# Patient Record
Sex: Female | Born: 1980 | Race: White | Hispanic: No | Marital: Married | Smoking: Never smoker
Health system: Southern US, Community
[De-identification: ages and names within clinical notes are randomized; demographics above are authoritative.]

## PROBLEM LIST (undated history)

## (undated) DIAGNOSIS — Z789 Other specified health status: Secondary | ICD-10-CM

## (undated) DIAGNOSIS — F329 Major depressive disorder, single episode, unspecified: Secondary | ICD-10-CM

## (undated) DIAGNOSIS — K589 Irritable bowel syndrome without diarrhea: Secondary | ICD-10-CM

## (undated) DIAGNOSIS — F32A Depression, unspecified: Secondary | ICD-10-CM

## (undated) DIAGNOSIS — Z6841 Body Mass Index (BMI) 40.0 and over, adult: Secondary | ICD-10-CM

## (undated) DIAGNOSIS — M545 Low back pain, unspecified: Secondary | ICD-10-CM

## (undated) DIAGNOSIS — F419 Anxiety disorder, unspecified: Secondary | ICD-10-CM

## (undated) HISTORY — DX: Major depressive disorder, single episode, unspecified: F32.9

## (undated) HISTORY — DX: Depression, unspecified: F32.A

## (undated) HISTORY — PX: CHOLECYSTECTOMY: SHX55

## (undated) HISTORY — PX: DILATION AND CURETTAGE OF UTERUS: SHX78

## (undated) HISTORY — DX: Anxiety disorder, unspecified: F41.9

## (undated) HISTORY — DX: Other specified health status: Z78.9

## (undated) HISTORY — PX: HX WISDOM TEETH EXTRACTION: SHX21

## (undated) HISTORY — PX: HX UPPER ENDOSCOPY: 2100001144

## (undated) HISTORY — PX: D&C FIRST TRIMESTER / TX INCOMPLETE / MISSED / SEPTIC / INDUCED ABORTION: SUR385

## (undated) HISTORY — PX: HX GALL BLADDER SURGERY/CHOLE: SHX55

## (undated) HISTORY — DX: Irritable bowel syndrome without diarrhea: K58.9

## (undated) HISTORY — DX: Low back pain, unspecified: M54.50

## (undated) HISTORY — PX: HX ELECTIVE ABORTION: 2100001500

---
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
## 1999-04-27 DIAGNOSIS — K589 Irritable bowel syndrome without diarrhea: Secondary | ICD-10-CM

## 1999-04-27 HISTORY — DX: Irritable bowel syndrome, unspecified: K58.9

## 2000-05-22 ENCOUNTER — Encounter: Payer: Self-pay | Admitting: Family Medicine

## 2000-05-22 ENCOUNTER — Ambulatory Visit (HOSPITAL_COMMUNITY): Admission: RE | Admit: 2000-05-22 | Discharge: 2000-05-22 | Payer: Self-pay | Admitting: Family Medicine

## 2001-04-10 ENCOUNTER — Other Ambulatory Visit: Admission: RE | Admit: 2001-04-10 | Discharge: 2001-04-10 | Payer: Self-pay | Admitting: Obstetrics & Gynecology

## 2001-05-02 ENCOUNTER — Ambulatory Visit (HOSPITAL_COMMUNITY): Admission: RE | Admit: 2001-05-02 | Discharge: 2001-05-02 | Payer: Self-pay | Admitting: Obstetrics & Gynecology

## 2001-05-02 ENCOUNTER — Encounter (INDEPENDENT_AMBULATORY_CARE_PROVIDER_SITE_OTHER): Payer: Self-pay | Admitting: Specialist

## 2001-07-17 ENCOUNTER — Ambulatory Visit (HOSPITAL_COMMUNITY): Admission: RE | Admit: 2001-07-17 | Discharge: 2001-07-17 | Payer: Self-pay | Admitting: Gastroenterology

## 2002-06-28 ENCOUNTER — Encounter: Payer: Self-pay | Admitting: *Deleted

## 2002-06-28 ENCOUNTER — Inpatient Hospital Stay (HOSPITAL_COMMUNITY): Admission: AD | Admit: 2002-06-28 | Discharge: 2002-06-28 | Payer: Self-pay | Admitting: *Deleted

## 2002-07-10 ENCOUNTER — Encounter: Payer: Self-pay | Admitting: Obstetrics and Gynecology

## 2002-07-10 ENCOUNTER — Inpatient Hospital Stay (HOSPITAL_COMMUNITY): Admission: AD | Admit: 2002-07-10 | Discharge: 2002-07-10 | Payer: Self-pay | Admitting: Obstetrics and Gynecology

## 2002-07-20 ENCOUNTER — Other Ambulatory Visit: Admission: RE | Admit: 2002-07-20 | Discharge: 2002-07-20 | Payer: Self-pay | Admitting: Obstetrics & Gynecology

## 2002-11-29 ENCOUNTER — Inpatient Hospital Stay (HOSPITAL_COMMUNITY): Admission: AD | Admit: 2002-11-29 | Discharge: 2002-11-29 | Payer: Self-pay | Admitting: Obstetrics & Gynecology

## 2003-02-18 ENCOUNTER — Inpatient Hospital Stay (HOSPITAL_COMMUNITY): Admission: AD | Admit: 2003-02-18 | Discharge: 2003-02-21 | Payer: Self-pay | Admitting: Obstetrics & Gynecology

## 2003-03-29 ENCOUNTER — Other Ambulatory Visit: Admission: RE | Admit: 2003-03-29 | Discharge: 2003-03-29 | Payer: Self-pay | Admitting: Obstetrics & Gynecology

## 2003-10-16 ENCOUNTER — Emergency Department (HOSPITAL_COMMUNITY): Admission: EM | Admit: 2003-10-16 | Discharge: 2003-10-17 | Payer: Self-pay | Admitting: Emergency Medicine

## 2004-07-06 ENCOUNTER — Emergency Department (HOSPITAL_COMMUNITY): Admission: EM | Admit: 2004-07-06 | Discharge: 2004-07-06 | Payer: Self-pay | Admitting: Emergency Medicine

## 2004-10-24 ENCOUNTER — Encounter: Admission: RE | Admit: 2004-10-24 | Discharge: 2004-10-24 | Payer: Self-pay | Admitting: Obstetrics and Gynecology

## 2004-10-26 DIAGNOSIS — G43909 Migraine, unspecified, not intractable, without status migrainosus: Secondary | ICD-10-CM

## 2004-10-26 HISTORY — DX: Migraine, unspecified, not intractable, without status migrainosus: G43.909

## 2004-12-29 ENCOUNTER — Ambulatory Visit: Payer: Self-pay | Admitting: Psychiatry

## 2004-12-29 ENCOUNTER — Other Ambulatory Visit (HOSPITAL_COMMUNITY): Admission: RE | Admit: 2004-12-29 | Discharge: 2005-01-10 | Payer: Self-pay | Admitting: Psychiatry

## 2005-01-07 ENCOUNTER — Emergency Department (HOSPITAL_COMMUNITY): Admission: EM | Admit: 2005-01-07 | Discharge: 2005-01-07 | Payer: Self-pay | Admitting: Emergency Medicine

## 2005-05-23 ENCOUNTER — Ambulatory Visit (HOSPITAL_COMMUNITY): Admission: RE | Admit: 2005-05-23 | Discharge: 2005-05-23 | Payer: Self-pay | Admitting: Obstetrics & Gynecology

## 2005-06-05 ENCOUNTER — Inpatient Hospital Stay (HOSPITAL_COMMUNITY): Admission: AD | Admit: 2005-06-05 | Discharge: 2005-06-07 | Payer: Self-pay

## 2005-10-27 ENCOUNTER — Emergency Department (HOSPITAL_COMMUNITY): Admission: EM | Admit: 2005-10-27 | Discharge: 2005-10-27 | Payer: Self-pay | Admitting: Emergency Medicine

## 2007-05-28 ENCOUNTER — Ambulatory Visit (HOSPITAL_COMMUNITY): Admission: RE | Admit: 2007-05-28 | Discharge: 2007-05-28 | Payer: Self-pay | Admitting: Obstetrics & Gynecology

## 2007-10-02 ENCOUNTER — Encounter: Admission: RE | Admit: 2007-10-02 | Discharge: 2007-10-02 | Payer: Self-pay | Admitting: Obstetrics & Gynecology

## 2007-12-17 ENCOUNTER — Inpatient Hospital Stay (HOSPITAL_COMMUNITY): Admission: RE | Admit: 2007-12-17 | Discharge: 2007-12-19 | Payer: Self-pay | Admitting: Obstetrics & Gynecology

## 2008-11-09 ENCOUNTER — Encounter: Admission: RE | Admit: 2008-11-09 | Discharge: 2008-11-09 | Payer: Self-pay | Admitting: Orthopaedic Surgery

## 2009-01-17 ENCOUNTER — Encounter
Admission: RE | Admit: 2009-01-17 | Discharge: 2009-01-17 | Payer: Self-pay | Admitting: Physical Medicine and Rehabilitation

## 2010-07-14 ENCOUNTER — Emergency Department (HOSPITAL_COMMUNITY): Admission: EM | Admit: 2010-07-14 | Discharge: 2010-07-14 | Payer: Self-pay | Admitting: Family Medicine

## 2011-04-13 NOTE — H&P (Signed)
NAME:  Jamie Turner, Jamie Turner                       ACCOUNT NO.:  [id number]   MEDICAL RECORD NO.:  [id number]                   PATIENT TYPE:  INP   LOCATION:  9174                                 FACILITY:  [facility]   PHYSICIAN:  Genia Del, M.D.             DATE OF BIRTH:  Nov 25, 1981   DATE OF ADMISSION:  02/18/2003  DATE OF DISCHARGE:                                HISTORY & PHYSICAL   PATIENT IDENTIFICATION:  The patient is a 30 year old G4 P0 A3.  Last  menstrual period was May 18, 2002, for an expected date of delivery on  February 22, 2003; at 39 weeks and three days gestation.   REASON FOR ADMISSION:  Induction for suspicion of macrosomia.   HISTORY OF PRESENT ILLNESS:  Fetal movement is positive.  No vaginal  bleeding, no fluid leak.  Irregular uterine contractions.  No PIH symptoms.  On last ultrasound, the estimated fetal weight was 91 percentile.  Amniotic  fluid index was within normal limits.  The cervix was favorable at 2 cm  dilated, 50% effaced, vertex, -2 to -1.   PAST MEDICAL HISTORY:  1. Migraines.  2. Irritable bowel syndrome.  3. Depression.   PAST SURGICAL HISTORY:  Therapeutic abortion and D&E for incomplete  spontaneous abortion x1.   ALLERGIES:  No known drug allergies.   MEDICATIONS:  Prenate vitamins.   FAMILY HISTORY:  Positive for very mild spina bifida in brother,  cardiovascular disease, and chronic hypertension.   HISTORY OF PRESENT PREGNANCY:  First trimester with some spotting, on  progesterone supplement.  The patient was also on Amoxil for tonsillitis.  An ultrasound in the first trimester confirmed dating.  The placenta was  partial previa at that time.  In the second trimester an ultrasound was done  for an increased risk of neural tube defects.  Spine and head were within  normal limits but limited.  A triple test was drawn and came back within  normal limits.  Ultrasound review of anatomy at 20 weeks was within normal  limits but  limited by maternal body habitus.  Placenta was anterior, normal,  and aminiotic fluid index within normal limits.  Cervical length 4.9 cm,  closed.  Dating corresponded.  In the third trimester, one-hour GTT was  within normal limits.  The patient was given Monistat for a vaginal  candidiasis.  She increased uterine contractions but no cervical change  preterm.  An estimated fetal weight was at 98 percentile at 32 weeks, and an  ultrasound was then repeated at 38+ weeks which showed the 91st percentile;  aminiotic fluid was at the 40th percentile.  Given a favorable cervix, the  decision was made to induce the patient at 39+ weeks.  Blood pressures  remained normal throughout pregnancy.   REVIEW OF SYSTEMS:  CONSTITUTIONAL:  Negative.  HEENT:  Negative.  CARDIOVASCULAR/RESPIRATORY:  Negative.  GI/URO:  Negative.  DERMATO/NEURO/ENDOCRINE:  Negative.   PHYSICAL  EXAMINATION:  GENERAL:  No apparent distress.  VITAL SIGNS:  Blood pressure 122/75, pulse 86, respiratory rate 20,  temperature afebrile.  LUNGS:  Clear bilaterally.  HEART:  Regular cardiac rhythm.  ABDOMEN:  Gravid uterus, cephalic presentation.  PELVIC:  Vaginal exam 2+ cm dilated, 70%, vertex, -2 to -1.  EXTREMITIES:  Lower limbs normal.  MONITORING:  Fetal heart rate 130s to 140s with good accelerations, good  variability, no decelerations.  No regular uterine contractions on  admission.   LABORATORY DATA:  Labs in the first trimester revealed hemoglobin 12.8,  platelets 361.  Blood type O positive, Rh antibodies negative.  Syphilis  nonreactive.  HBsAg negative.  HIV declined.  Rubella titer immune.  She had  a group B strep negative at 35 weeks.   IMPRESSION:  1. G4 P0 A3 at 39+ weeks gestation with suspicion of macrosomia, for     induction.  2. Fetal well being reassuring.  3. Group B strep negative.  4. Favorable cervix.   PLAN:  1. Admit to labor and delivery.  2. Pitocin induction.  3. Artificial rupture of  membranes when possible.  4. Monitoring.  5. Expectant management with probable vaginal delivery.                                               Genia Del, M.D.    ML/MEDQ  D:  02/18/2003  T:  02/18/2003  Job:  [id number]

## 2011-04-13 NOTE — Op Note (Signed)
[facility]  Patient:    Jamie Turner, Jamie Turner                    MRN: [id number] Proc. Date: 05/02/01 Adm. Date:  [id number] Attending:  Genia Del                           Operative Report  DATE OF BIRTH:                July 02, 1981.  PREOPERATIVE DIAGNOSIS:       An 8+ weeks gestation, missed abortion.  POSTOPERATIVE DIAGNOSIS:      An 8+ weeks gestation, missed abortion.  OPERATION:                    Dilatation and evacuation.  SURGEON:                      Genia Del, M.D.  ANESTHESIOLOGIST:             Ellison Hughs., M.D.  ANESTHESIA:                   MAC, paracervical block.  ESTIMATED BLOOD LOSS:         75 cc.  DESCRIPTION OF PROCEDURE:     Under MAC analgesia, the patient is in lithotomy position. She is prepped with Betadine on the suprapubic, vulvar, and vaginal areas. The bladder is catheterized and the patient is draped as usual. The vaginal exam revealed an anteverted uterus about 8 to [redacted] weeks gestation, mobile, no adnexal mass, cervix is closed and long, no vaginal bleeding. The speculum is inserted. The paracervical block is done with a total of 30 cc of lidocaine 1% at 4 oclock and 8 oclock. We then grasp the anterior lip of the cervix with the tenaculum. Dilatation of the cervix done with Hegar dilators up to #31 easily. A #9 curved suction curet is then used. Systematic suction of the intrauterine cavity done with the suction curet. Products of conception are evacuated corresponding to 8 to [redacted] weeks gestation and sent to pathology. We then complete the curettage with the sharp curet hearing the uterine sound on all surfaces of the intrauterine cavity. We then finish with the suction curet to evacuate any products of conception or blood clots. Hemostasis is adequate. All instruments are removed. The estimated blood loss was 75 cc. No complication occurred and the patient was transferred to recovery room  in good status. Her blood group is O positive. DD:  05/02/01 TD:  05/03/01 Job: [id number]

## 2011-04-13 NOTE — H&P (Signed)
NAMENOVELLE, ADDAIR             ACCOUNT NO.:  [id number]   MEDICAL RECORD NO.:  [id number]          PATIENT TYPE:  INP   LOCATION:  9167                          FACILITY:  [facility]   PHYSICIAN:  Genia Del, M.D.DATE OF BIRTH:  [birth year]   DATE OF ADMISSION:  06/05/2005  DATE OF DISCHARGE:                                HISTORY & PHYSICAL   PATIENT IDENTIFICATION:  [name] is a 30 year old G5 P1 A3 at 38 weeks  6 days gestation. Estimated date of delivery on June 13, 2005.   REASON FOR ADMISSION:  Induction, advanced cervical dilatation with  suspicion of macrosomia.   HISTORY OF PRESENT ILLNESS:  Fetal movements positive. No regular uterine  contractions, no vaginal bleeding, no fluid leak. No PIH symptoms.   PAST MEDICAL HISTORY:  Migraines, obesity.   PAST PSYCHIATRIC HISTORY:  Depression.   PAST SURGICAL HISTORY:  Negative.   PAST OBSTETRICAL HISTORY:  1.  Therapeutic abortion 12 weeks in 1996.  2.  Spontaneous abortin at 7 weeks in June 2001.  3.  Spontaneous abortion with D&E at 8 weeks in June 2002.  4.  Spontaneous vaginal delivery at 39 weeks, girl, 9 pounds 10 ounces, in      March 2004.   MEDICATIONS:  Prenate vitamins.   No known drug allergies.   SOCIAL HISTORY:  Married, nonsmoker.   HISTORY OF PRESENT PREGNANCY:  First trimester normal. O positive, rubella  immune, STD workup negative. Triple test within normal limits. Ultrasound  review of anatomy within normal limits. One-hour GTT 170, abnormal. Three-  hour GTT normal. In the third trimester, ultrasound revealed macrosomia but  below 10 pounds estimated fetal weight. Group B strep was negative at 35+  weeks.   REVIEW OF SYSTEMS:  CONSTITUTIONAL:  Negative. HEENT:  Negative.  CARDIOVASCULAR, RESPIRATORY:  Negative. GI, URO:  Negative. DERMATO,  ENDOCRINO, NEURO:  Negative.   PHYSICAL EXAMINATION:  GENERAL:  No apparent distress.  VITAL SIGNS:  Stable. Blood pressure 114/63, pulse 91,  respiratory rate 18,  temperature 97.8.  LUNGS:  Clear bilaterally.  HEART:  Regular cardiac rhythm.  ABDOMEN:  Gravid uterus, cephalic presentation.  PELVIC:  Vaginal exam on admission, cervix 3-4 cm, 70%, vertex, -2.  Membranes intact.  LOWER LIMBS:  Mild edema.  FETAL HEART RATE MONITORING:  140s, reactive, no decelerations. Uterine  contractions irregular, mild.   IMPRESSION:  Gravida 5 para 1 abortus 3 at 38 weeks 6 days gestation with  advanced cervical dilatation and suspicion of macrosomia. Fetal well-being  reassuring. Group B strep negative.   PLAN:  1.  Admit to labor and delivery.  2.  Monitoring.  3.  Pitocin.  4.  Artificial rupture of membranes.  5.  Expectant managements towards probable vaginal delivery.       ML/MEDQ  D:  06/05/2005  T:  06/05/2005  Job:  [id number]

## 2011-04-13 NOTE — Procedures (Signed)
[facility]  Patient:    Jamie Turner, Jamie Turner Visit Number: [id number] MRN: [id number]          Service Type: END Location: ENDO Attending Physician:  Charna Elizabeth Proc. Date: 07/17/01 Adm. Date:  07/17/2001   CC:         Elgie Congo, M.D.   Procedure Report  DATE OF BIRTH:  [birth year]  PROCEDURE:  Colonoscopy.  ENDOSCOPIST:  Anselmo Rod, M.D.  INSTRUMENT USED:  Olympus video colonoscope.  INDICATIONS FOR PROCEDURE:  Rectal bleeding with diarrhea and severe abdominal pain especially in the left lower quadrant in a 30 year old white female with colitis, juvenile polyps, etc.  PREPROCEDURE PREPARATION:  Informed consent was procured from the patient and the patient fasted for eight hours prior to the procedure and prep was a bottle of magnesium citrate and a gallon of nulytely the night prior to the procedure.  PREPROCEDURE PHYSICAL:  VITAL SIGNS:  The patient had stable vital signs.  NECK:  Supple.  CHEST:  Clear to auscultation, S1, S2 regular.  ABDOMEN:  Soft with normal bowel sounds. Left lower quadrant tenderness on palpation with guarding, no rebound. No hepatosplenomegaly.  DESCRIPTION OF PROCEDURE:  The patient was placed in the left lateral decubitus position and sedated with 50 mg of Demerol and 10 mg of Versed intravenously. Once the patient was adequately sedated and maintained on low flow oxygen and continuous cardiac monitoring, the Olympus video colonoscope was advanced from the rectum to the cecum without difficulty. Except for a small internal hemorrhoids, no other abnormalities were seen. The patient did experience some abdominal discomfort with insufflation of air into the colon indicating visceral hypersensitivity. No masses, polyps, erosions, ulcerations or diverticula were present. The terminal ileum appeared healthy as well.  IMPRESSION:  Normal colonoscopy to the terminal ileum except for  small nonbleeding internal hemorrhoids.  RECOMMENDATIONS: 1. The patient and his wife to follow-up in the office in the next two weeks. 2. He is to continue a high fiber diet. 3. Trial of Lotronex will be considered considering the problem of    diarrhea ______. Attending Physician:  Charna Elizabeth DD:  07/17/01 TD:  07/18/01 Job: [id number]

## 2011-08-16 LAB — CBC
HCT: 31.5 — ABNORMAL LOW
Hemoglobin: 13
MCHC: 34.6
MCV: 84.5
RBC: 3.73 — ABNORMAL LOW
RBC: 4.45
WBC: 14.6 — ABNORMAL HIGH

## 2012-07-09 ENCOUNTER — Ambulatory Visit (INDEPENDENT_AMBULATORY_CARE_PROVIDER_SITE_OTHER): Payer: Self-pay | Admitting: Family Medicine

## 2012-07-09 VITALS — BP 112/80 | HR 70 | Temp 98.1°F | Resp 16 | Ht 67.0 in | Wt 219.0 lb

## 2012-07-09 DIAGNOSIS — L049 Acute lymphadenitis, unspecified: Secondary | ICD-10-CM

## 2012-07-09 DIAGNOSIS — I889 Nonspecific lymphadenitis, unspecified: Secondary | ICD-10-CM

## 2012-07-09 DIAGNOSIS — L729 Follicular cyst of the skin and subcutaneous tissue, unspecified: Secondary | ICD-10-CM | POA: Insufficient documentation

## 2012-07-09 LAB — POCT CBC
Granulocyte percent: 74.3 %G (ref 37–80)
MID (cbc): 0.5 (ref 0–0.9)
MPV: 9 fL (ref 0–99.8)
POC MID %: 4.8 %M (ref 0–12)
Platelet Count, POC: 339 10*3/uL (ref 142–424)
RBC: 4.85 M/uL (ref 4.04–5.48)

## 2012-07-09 MED ORDER — DOXYCYCLINE HYCLATE 100 MG PO TABS: 100.0000 mg | ORAL_TABLET | Freq: Two times a day (BID) | ORAL | Status: AC

## 2012-07-09 NOTE — Progress Notes (Addendum)
  Subjective:    Patient ID: JAVARIA KNAPKE, female    DOB: 08-30-1981, 31 y.o.   MRN: [id number]  HPI 31 year old female with no significant past medical history is coming in with a scalp bump that she noticed approximately one week ago. Patient also states that she's had 2 other subsequent on her neck. Patient states that to undergo very tender recently but she denies any discharge, any fevers or chills, or any viral-like symptoms. Patient states she's never had anything like this before. When discussing associated symptoms patient does state that she's had some hair loss recently in her hands have been cold. Patient states that she is also gained some weight recently. Patient does give a significant history of her husband having a severe scalp infection recently that is causing him to lose all his hair on his head. He is going to be seeing a specialist here in the near future.   Review of Systems Denies any fever, chills, any stroke in the area, any visual changes or any headache or any neck pain.    Objective:   Physical Exam General: No apparent distress Scalp exam: Patient does have a very small what appears to be ingrown hair versus cyst approximately 0.25 cm in diameter skin does have some mild erythema but nontender on exam. Patient though coming down at the occipital region remains the cervical spine has a lymph node that is extremely tender to palpation. About 4-5 cm distal to that in the posterior chain patient has another a lymph node that even larger approximately 1 cm in diameter that is very tender to palpation.  Neurovascularly intact distally. No redness of the skin in the neck no tracking seen.     Assessment & Plan:  Discussed with Dr. Katrinka Blazing and agree.

## 2012-07-09 NOTE — Patient Instructions (Addendum)
Very nice to meet you. I think you have a small cyst on the top of her head but I do not think problem. I think your lymph nodes are enlarged. For this I'm going to give you an antibiotic. Take this pill twice daily for the next 10 days. I'm also going to get some labs with you and we will try to contact with results. If you don't hear from [place] hopefully followup next week and at that point we'll have the results we can go over them together.  Lymphadenopathy Lymphadenopathy means "disease of the lymph glands." But the term is usually used to describe swollen or enlarged lymph glands, also called lymph nodes. These are the bean-shaped organs found in many locations including the neck, underarm, and groin. Lymph glands are part of the immune system, which fights infections in your body. Lymphadenopathy can occur in just one area of the body, such as the neck, or it can be generalized, with lymph node enlargement in several areas. The nodes found in the neck are the most common sites of lymphadenopathy. CAUSES  When your immune system responds to germs (such as viruses or bacteria ), infection-fighting cells and fluid build up. This causes the glands to grow in size. This is usually not something to worry about. Sometimes, the glands themselves can become infected and inflamed. This is called lymphadenitis. Enlarged lymph nodes can be caused by many diseases:  Bacterial disease, such as strep throat or a skin infection.   Viral disease, such as a common cold.   Other germs, such as lyme disease, tuberculosis, or sexually transmitted diseases.   Cancers, such as lymphoma (cancer of the lymphatic system) or leukemia (cancer of the white blood cells).   Inflammatory diseases such as lupus or rheumatoid arthritis.   Reactions to medications.  Many of the diseases above are rare, but important. This is why you should see your caregiver if you have lymphadenopathy. SYMPTOMS   Swollen, enlarged lumps  in the neck, back of the head or other locations.   Tenderness.   Warmth or redness of the skin over the lymph nodes.   Fever.  DIAGNOSIS  Enlarged lymph nodes are often near the source of infection. They can help healthcare providers diagnose your illness. For instance:   Swollen lymph nodes around the jaw might be caused by an infection in the mouth.   Enlarged glands in the neck often signal a throat infection.   Lymph nodes that are swollen in more than one area often indicate an illness caused by a virus.  Your caregiver most likely will know what is causing your lymphadenopathy after listening to your history and examining you. Blood tests, x-rays or other tests may be needed. If the cause of the enlarged lymph node cannot be found, and it does not go away by itself, then a biopsy may be needed. Your caregiver will discuss this with you. TREATMENT  Treatment for your enlarged lymph nodes will depend on the cause. Many times the nodes will shrink to normal size by themselves, with no treatment. Antibiotics or other medicines may be needed for infection. Only take over-the-counter or prescription medicines for pain, discomfort or fever as directed by your caregiver. HOME CARE INSTRUCTIONS  Swollen lymph glands usually return to normal when the underlying medical condition goes away. If they persist, contact your health-care provider. He/she might prescribe antibiotics or other treatments, depending on the diagnosis. Take any medications exactly as prescribed. Keep any follow-up appointments made  to check on the condition of your enlarged nodes.  SEEK MEDICAL CARE IF:   Swelling lasts for more than two weeks.   You have symptoms such as weight loss, night sweats, fatigue or fever that does not go away.   The lymph nodes are hard, seem fixed to the skin or are growing rapidly.   Skin over the lymph nodes is red and inflamed. This could mean there is an infection.  SEEK IMMEDIATE  MEDICAL CARE IF:   Fluid starts leaking from the area of the enlarged lymph node.   You develop a fever of 102 F (38.9 C) or greater.   Severe pain develops (not necessarily at the site of a large lymph node).   You develop chest pain or shortness of breath.   You develop worsening abdominal pain.  MAKE SURE YOU:   Understand these instructions.   Will watch your condition.   Will get help right away if you are not doing well or get worse.  Document Released: 08/21/2008 Document Revised: 11/01/2011 Document Reviewed: 08/21/2008 [facility] Patient Information 2012 [place], [place].

## 2012-07-09 NOTE — Assessment & Plan Note (Signed)
Patient has 2 lymph nodes in the posterior chain in cervical region that are very tender to palpation. With patient's past medical history not show any type of infection recently but having her husband who has a recent infection we will cover for potential infections. Patient will start on doxycycline twice daily for an 10 days patient will return in one week for followup to make sure patient is improving. Patient given red flags as well as a handout given red flags and when to seek medical attention.

## 2012-07-09 NOTE — Assessment & Plan Note (Signed)
On left parietal lobe area. Very small nontender no signs of infection at this point. May be an ingrown hair. We'll continue to monitor. Patient is to be put on doxycycline due to lymphadenitis that was found. Patient will followup in one week to make sure that this is not worsening. Patient does have a history of her husband having a scalp infection recently.

## 2012-07-10 LAB — T4, FREE: Free T4: 0.95 ng/dL (ref 0.80–1.80)

## 2012-07-10 NOTE — Progress Notes (Signed)
  Subjective:    Patient ID: Jamie Turner, female    DOB: [birth year], 31 y.o.   MRN: [id number]  HPI    Review of Systems     Objective:   Physical Exam        Assessment & Plan:

## 2012-10-04 ENCOUNTER — Ambulatory Visit (INDEPENDENT_AMBULATORY_CARE_PROVIDER_SITE_OTHER): Payer: 59 | Admitting: Emergency Medicine

## 2012-10-04 VITALS — BP 111/77 | HR 73 | Temp 97.3°F | Resp 16 | Ht 68.0 in | Wt 233.6 lb

## 2012-10-04 DIAGNOSIS — R112 Nausea with vomiting, unspecified: Secondary | ICD-10-CM

## 2012-10-04 DIAGNOSIS — G43909 Migraine, unspecified, not intractable, without status migrainosus: Secondary | ICD-10-CM

## 2012-10-04 MED ORDER — ONDANSETRON 4 MG PO TBDP
4.0000 mg | ORAL_TABLET | Freq: Once | ORAL | Status: AC
  Administered 2012-10-04: 4 mg via ORAL

## 2012-10-04 MED ORDER — ONDANSETRON 8 MG PO TBDP: 8.0000 mg | ORAL_TABLET | Freq: Three times a day (TID) | ORAL | Status: DC | PRN

## 2012-10-04 NOTE — Progress Notes (Signed)
IV insertion attempts x 2 unsuccessful. Patient reports nausea has improved slightly so will try po fluids.

## 2012-10-04 NOTE — Progress Notes (Signed)
[facility] [address], [place] [address] [phone]  Date:  10/04/2012   Name:  Jamie Turner   DOB:  [birth year]   MRN:  [id number]  PCP:  No primary provider on file.    Chief Complaint: Emesis   History of Present Illness:  ANTOYA SEACAT is a 31 y.o. very pleasant female patient who presents with the following:  Last night had a very difficult work shift and this morning developed a migraine headache.  Later this morning began to vomit and has had 5 separate episodes of vomiting.  No fever or chills, no diarrhea.  Ate at 11:00 and has had little effective PO.  Headache now resolved with ibuprofen.  Has a history of migraine and this is typical of her usual migraine headaches.  Still nauseated.  Has an acute fever blister lower lip and some right mid back pain that started today.    Patient Active Problem List  Diagnosis  . Lymphadenitis, acute  . Scalp cyst    Past Medical History  Diagnosis Date  . Anxiety     Past Surgical History  Procedure Date  . Cholecystectomy     History  Substance Use Topics  . Smoking status: Never Smoker   . Smokeless tobacco: Not on file  . Alcohol Use: Yes     Comment: occassionally    No family history on file.  No Known Allergies  Medication list has been reviewed and updated.  Current Outpatient Prescriptions on File Prior to Visit  Medication Sig Dispense Refill  . ketoprofen (ORUDIS) 75 MG capsule Take 75 mg by mouth 4 (four) times daily as needed.        Review of Systems:  As per HPI, otherwise negative.    Physical Examination: Filed Vitals:   10/04/12 1639  BP: 111/77  Pulse: 73  Temp: 97.3 F (36.3 C)  Resp: 16   Filed Vitals:   10/04/12 1639  Height: 5\' 8"  (1.727 m)  Weight: 233 lb 9.6 oz (105.96 kg)   Body mass index is 35.52 kg/(m^2). Ideal Body Weight: Weight in (lb) to have BMI = 25: 164.1   GEN: WDWN, NAD, Non-toxic, A & O x 3  No rash or icterus or sepsis.   Tongue dry HEENT: Atraumatic, Normocephalic. Neck supple. No masses, No LAD.  Oropharynx negative  Fever blister on lower lip Ears and Nose: No external deformity.  TM negative CV: RRR, No M/G/R. No JVD. No thrill. No extra heart sounds. PULM: CTA B, no wheezes, crackles, rhonchi. No retractions. No resp. distress. No accessory muscle use. ABD: S, NT, ND, +BS. No rebound. No HSM. EXTR: No c/c/e NEURO Normal gait.  PSYCH: Normally interactive. Conversant. Not depressed or anxious appearing.  Calm demeanor.    Assessment and Plan: Migraine headache Nausea and vomiting zomig Clears Follow up for new or worsened symptoms and failure to improve  Carmelina Dane, MD

## 2012-10-12 NOTE — Progress Notes (Signed)
Reviewed and agree.

## 2013-02-04 ENCOUNTER — Ambulatory Visit (INDEPENDENT_AMBULATORY_CARE_PROVIDER_SITE_OTHER): Payer: 59 | Admitting: Family Medicine

## 2013-02-04 VITALS — BP 110/80 | HR 76 | Temp 98.3°F | Resp 16 | Ht 67.0 in | Wt 255.0 lb

## 2013-02-04 DIAGNOSIS — J020 Streptococcal pharyngitis: Secondary | ICD-10-CM

## 2013-02-04 DIAGNOSIS — J029 Acute pharyngitis, unspecified: Secondary | ICD-10-CM

## 2013-02-04 LAB — POCT RAPID STREP A (OFFICE): Rapid Strep A Screen: POSITIVE — AB

## 2013-02-04 MED ORDER — PENICILLIN G BENZATHINE 1200000 UNIT/2ML IM SUSP
1.2000 10*6.[IU] | Freq: Once | INTRAMUSCULAR | Status: AC
  Administered 2013-02-04: 1.2 10*6.[IU] via INTRAMUSCULAR

## 2013-02-04 NOTE — Patient Instructions (Addendum)
You are still contagious for 24 hours after your shot.  If you are not feeling better in the next 1 or 2 days please let us know- Sooner if worse.

## 2013-02-04 NOTE — Progress Notes (Signed)
[facility] [address], [place] [address] [phone]  Date:  02/04/2013   Name:  Jamie Turner   DOB:  March 04, 1981   MRN:  [id number]  PCP:  No primary provider on file.    Chief Complaint: Sore Throat   History of Present Illness:  Jamie Turner is a 32 y.o. very pleasant female patient who presents with the following:  Here today with a ST that started yesterday.  No known strep contact, but one of her daughters did have a fever 2 days ago- she is now well.   She feels tired and achy, has noted swollen cervical glands.  She has been prone to strep throat in the past.   She has not noted any runny nose, sneezing or coughing, but she does have a HA She had a temp of 99.9 this am.  She has not taken any meds yet today No GI symptoms.  She does note some phlegm in her throat.   Patient Active Problem List  Diagnosis  . Lymphadenitis, acute  . Scalp cyst    Past Medical History  Diagnosis Date  . Anxiety     Past Surgical History  Procedure Laterality Date  . Cholecystectomy      History  Substance Use Topics  . Smoking status: Never Smoker   . Smokeless tobacco: Not on file  . Alcohol Use: Yes     Comment: occassionally    History reviewed. No pertinent family history.  No Known Allergies  Medication list has been reviewed and updated.  Current Outpatient Prescriptions on File Prior to Visit  Medication Sig Dispense Refill  . ketoprofen (ORUDIS) 75 MG capsule Take 75 mg by mouth 4 (four) times daily as needed.      . ondansetron (ZOFRAN-ODT) 8 MG disintegrating tablet Take 1 tablet (8 mg total) by mouth every 8 (eight) hours as needed for nausea.  30 tablet  0   No current facility-administered medications on file prior to visit.    Review of Systems:  As per HPI- otherwise negative.   Physical Examination: Filed Vitals:   02/04/13 0837  BP: 110/80  Pulse: 76  Temp: 98.3 F (36.8 C)  Resp: 16   Filed Vitals:    02/04/13 0837  Height: 5\' 7"  (1.702 m)  Weight: 255 lb (115.667 kg)   Body mass index is 39.93 kg/(m^2). Ideal Body Weight: Weight in (lb) to have BMI = 25: 159.3  GEN: WDWN, NAD, Non-toxic, A & O x 3, obese HEENT: Atraumatic, Normocephalic. Neck supple. No masses, small tender right cervical notes palpated.  Bilateral TM wnl, oropharynx shows injection and some exudate on the right.  PEERL,EOMI.   Ears and Nose: No external deformity. CV: RRR, No M/G/R. No JVD. No thrill. No extra heart sounds. PULM: CTA B, no wheezes, crackles, rhonchi. No retractions. No resp. distress. No accessory muscle use. ABD: S, NT, ND EXTR: No c/c/e NEURO Normal gait.  PSYCH: Normally interactive. Conversant. Not depressed or anxious appearing.  Calm demeanor.   Results for orders placed in visit on 02/04/13  POCT RAPID STREP A (OFFICE)      Result Value Range   Rapid Strep A Screen Positive (*) Negative    Assessment and Plan: Acute pharyngitis - Plan: POCT rapid strep A  Streptococcal sore throat - Plan: penicillin g benzathine (BICILLIN LA) 1200000 UNIT/2ML injection 1.2 Million Units  Strep throat.  [name] would prefer a bicillin shot- will give this  to her today.  See patient instructions for more details.     Abbe Amsterdam, MD

## 2013-03-23 ENCOUNTER — Ambulatory Visit (INDEPENDENT_AMBULATORY_CARE_PROVIDER_SITE_OTHER): Payer: 59 | Admitting: Family Medicine

## 2013-03-23 VITALS — BP 105/69 | HR 71 | Temp 98.4°F | Resp 17 | Ht 68.0 in | Wt 253.8 lb

## 2013-03-23 DIAGNOSIS — S39012A Strain of muscle, fascia and tendon of lower back, initial encounter: Secondary | ICD-10-CM

## 2013-03-23 DIAGNOSIS — S335XXA Sprain of ligaments of lumbar spine, initial encounter: Secondary | ICD-10-CM

## 2013-03-23 DIAGNOSIS — M545 Low back pain, unspecified: Secondary | ICD-10-CM

## 2013-03-23 MED ORDER — METHOCARBAMOL 750 MG PO TABS: ORAL_TABLET | ORAL | Status: DC

## 2013-03-23 MED ORDER — TRAMADOL HCL 50 MG PO TABS: 50.0000 mg | ORAL_TABLET | Freq: Three times a day (TID) | ORAL | Status: DC | PRN

## 2013-03-23 MED ORDER — OXAPROZIN 600 MG PO TABS: ORAL_TABLET | ORAL | Status: DC

## 2013-03-23 NOTE — Patient Instructions (Addendum)
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

## 2013-03-23 NOTE — Progress Notes (Signed)
  Subjective: Patient is here complaining of low back pain. She's had problems in the past, but over the last 3 weeks this is a new flare. It hurts with a very low back. She went to see a Land. He has been working on her, but suggested she come see a doctor for some muscle relaxants.  Objective: Reviewed her x-rays from the other Dr., and they were unremarkable. Slight curvature of the lumbar spine may be just from positioning and/or spasming  She has poor flexion and good extension of her back. Side to side motion has more pain when she tilts to the right. Straight leg is negative. She's tender in the lumbosacral region.  Assessment: Low back pain   Plan: back exercise booklet

## 2013-06-08 ENCOUNTER — Ambulatory Visit (INDEPENDENT_AMBULATORY_CARE_PROVIDER_SITE_OTHER): Payer: 59 | Admitting: Emergency Medicine

## 2013-06-08 VITALS — BP 126/82 | HR 118 | Temp 98.0°F | Resp 18 | Ht 68.0 in | Wt 228.4 lb

## 2013-06-08 DIAGNOSIS — T7840XA Allergy, unspecified, initial encounter: Secondary | ICD-10-CM

## 2013-06-08 DIAGNOSIS — R21 Rash and other nonspecific skin eruption: Secondary | ICD-10-CM

## 2013-06-08 LAB — POCT CBC
Granulocyte percent: 74.2 %G (ref 37–80)
HCT, POC: 41.6 % (ref 37.7–47.9)
MCV: 91 fL (ref 80–97)
Platelet Count, POC: 314 10*3/uL (ref 142–424)
RBC: 4.57 M/uL (ref 4.04–5.48)

## 2013-06-08 NOTE — Patient Instructions (Addendum)
Please stop the Topamax and  phentermine. Please take Zyrtec 10 mg one a day Benadryl 25 mg every 4-6 hours and Zantac 150 mg twice a day. If symptoms persist will make a referral to an allergist to do testing  Allergic Reaction, Mild to Moderate Allergies may happen from anything your body is sensitive to. This may be food, medications, pollens, chemicals, and nearly anything around you in everyday life that produces allergens. An allergen is anything that causes an allergy producing substance. Allergens cause your body to release allergic antibodies. Through a chain of events, they cause a release of histamine into the blood stream. Histamines are meant to protect you, but they also cause your discomfort. This is why antihistamines are often used for allergies. Heredity is often a factor in causing allergic reactions. This means you may have some of the same allergies as your parents. Allergies happen in all age groups. You may have some idea of what caused your reaction. There are many allergens around [place]. It may be difficult to know what caused your reaction. If this is a first time event, it may never happen again. Allergies cannot be cured but can be controlled with medications. SYMPTOMS  You may get some or all of the following problems from allergies.  Swelling and itching in and around the mouth.   Tearing, itchy eyes.   Nasal congestion and runny nose.   Sneezing and coughing.   An itchy red rash or hives.   Vomiting or diarrhea.   Difficulty breathing.  Seasonal allergies occur in all age groups. They are seasonal because they usually occur during the same season every year. They may be a reaction to molds, grass pollens, or tree pollens. Other causes of allergies are house dust mite allergens, pet dander and mold spores. These are just a common few of the thousands of allergens around [place]. All of the symptoms listed above happen when you come in contact with pollens and other  allergens. Seasonal allergies are usually not life threatening. They are generally more of a nuisance that can often be handled using medications. Hay fever is a combination of all or some of the above listed allergy problems. It may often be treated with simple over-the-counter medications such as diphenhydramine. Take medication as directed. Check with your caregiver or package insert for child dosages. TREATMENT AND HOME CARE INSTRUCTIONS If hives or rash are present:  Take medications as directed.   You may use an over-the-counter antihistamine (diphenhydramine) for hives and itching as needed. Do not drive or drink alcohol until medications used to treat the reaction have worn off. Antihistamines tend to make people sleepy.   Apply cold cloths (compresses) to the skin or take baths in cool water. This will help itching. Avoid hot baths or showers. Heat will make a rash and itching worse.   If your allergies persist and become more severe, and over the counter medications are not effective, there are many new medications your caretaker can prescribe. Immunotherapy or desensitizing injections can be used if all else fails. Follow up with your caregiver if problems continue.  SEEK MEDICAL CARE IF:   Your allergies are becoming progressively more troublesome.   You suspect a food allergy. Symptoms generally happen within 30 minutes of eating a food.   Your symptoms have not gone away within 2 days or are getting worse.   You develop new symptoms.   You want to retest yourself or your child with a food or drink  you think causes an allergic reaction. Never test yourself or your child of a suspected allergy without being under the watchful eye of your caregivers. A second exposure to an allergen may be life-threatening.  SEEK IMMEDIATE MEDICAL CARE IF:  You develop difficulty breathing or wheezing, or have a tight feeling in your chest or throat.   You develop a swollen mouth, hives,  swelling, or itching all over your body.  A severe reaction with any of the above problems should be considered life-threatening. If you suddenly develop difficulty breathing call for local emergency medical help. THIS IS AN EMERGENCY. MAKE SURE YOU:   Understand these instructions.   Will watch your condition.   Will get help right away if you are not doing well or get worse.  Document Released: 09/09/2007 Document Revised: 11/01/2011 Document Reviewed: 09/09/2007 [facility] Patient Information 2012 [place], [place].

## 2013-06-08 NOTE — Progress Notes (Signed)
  Subjective:    Patient ID: Jamie Turner, female    DOB: [birth year], 32 y.o.   MRN: [id number]  HPI and states that 2 weeks ago she started having a pruritic rash which is involving her extremities. Following this she started to have a rash which involved her abdomen legs. She feels her hands and feet are puffy. She has been on phentermine and Topamax since May. These are the only 2 new medications in her regimen she has no other ongoing medical problems    Review of Systems     Objective:   Physical Exam is a fine maculopapular rash involving the arms and legs. Her throat is normal her chest is clear there is a mild puffiness to her hands and feet  Results for orders placed in visit on 06/08/13  POCT CBC      Result Value Range   WBC 5.3  4.6 - 10.2 K/uL   Lymph, poc 1.0  0.6 - 3.4   POC LYMPH PERCENT 19.4  10 - 50 %L   MID (cbc) 0.3  0 - 0.9   POC MID % 6.4  0 - 12 %M   POC Granulocyte 3.9  2 - 6.9   Granulocyte percent 74.2  37 - 80 %G   RBC 4.57  4.04 - 5.48 M/uL   Hemoglobin 13.3  12.2 - 16.2 g/dL   HCT, POC 56.2  13.0 - 47.9 %   MCV 91.0  80 - 97 fL   MCH, POC 29.1  27 - 31.2 pg   MCHC 32.0  31.8 - 35.4 g/dL   RDW, POC 86.5     Platelet Count, POC 314  142 - 424 K/uL   MPV 8.6  0 - 99.8 fL        Assessment & Plan:  She will be treated with zyrtec 1 a day. Benadryl. Zantac.Aveeno. Hold phentermine and topamax

## 2013-08-31 ENCOUNTER — Ambulatory Visit (INDEPENDENT_AMBULATORY_CARE_PROVIDER_SITE_OTHER): Payer: 59 | Admitting: Surgery

## 2013-08-31 ENCOUNTER — Encounter (INDEPENDENT_AMBULATORY_CARE_PROVIDER_SITE_OTHER): Payer: Self-pay | Admitting: Surgery

## 2013-08-31 VITALS — BP 128/76 | HR 68 | Temp 98.2°F | Resp 16 | Ht 68.0 in | Wt 257.8 lb

## 2013-08-31 DIAGNOSIS — R1032 Left lower quadrant pain: Secondary | ICD-10-CM

## 2013-08-31 NOTE — Patient Instructions (Signed)
Will arrange for CT scan.  Further recommendations to follow.

## 2013-08-31 NOTE — Progress Notes (Signed)
Patient ID: Jamie Turner, female   DOB: [birth year], 32 y.o.   MRN: [id number]  Chief Complaint  Patient presents with  . New Evaluation    eval lower left abd wall pain    HPI Jamie Turner is a 32 y.o. female.  Patient is sent at the request of Dr.Lavoie for left lower quadrant abdominal pain. This is been present intermittently for 2 months. The pain is sharp in nature and comes and goes lasting seconds to minutes. The pain is worse when her bladder is full she states. She has a history of ovarian cysts and has had a pelvic ultrasound which demonstrated a right ovarian cyst. No evidence of left ovarian cyst. The pain is severe. It's not associated with activity. She has a history of IBS. No recent weight loss. No blood in her stool. No fever or chills. She currently is pain free. HPI  Past Medical History  Diagnosis Date  . Anxiety   . Depression     Past Surgical History  Procedure Laterality Date  . Cholecystectomy    . Dilation and curettage of uterus      History reviewed. No pertinent family history.  Social History History  Substance Use Topics  . Smoking status: Never Smoker   . Smokeless tobacco: Not on file  . Alcohol Use: Yes     Comment: occassionally    No Known Allergies  Current Outpatient Prescriptions  Medication Sig Dispense Refill  . ketoprofen (ORUDIS) 75 MG capsule Take 75 mg by mouth 4 (four) times daily as needed.       No current facility-administered medications for this visit.    Review of Systems Review of Systems  Constitutional: Negative.   HENT: Negative.   Eyes: Negative.   Respiratory: Negative.   Cardiovascular: Negative.   Gastrointestinal: Positive for abdominal pain.  Endocrine: Negative.   Genitourinary: Negative.   Musculoskeletal: Negative.   Skin: Negative.   Hematological: Negative.   Psychiatric/Behavioral: Negative.     Blood pressure 128/76, pulse 68, temperature 98.2 F (36.8 C), temperature source  Temporal, resp. rate 16, height 5\' 8"  (1.727 m), weight 257 lb 12.8 oz (116.937 kg).  Physical Exam Physical Exam  Constitutional: She is oriented to person, place, and time. She appears well-developed and well-nourished.  HENT:  Head: Normocephalic and atraumatic.  Eyes: EOM are normal. Pupils are equal, round, and reactive to light.  Neck: Normal range of motion. Neck supple.  Cardiovascular: Normal rate and regular rhythm.   Pulmonary/Chest: Effort normal and breath sounds normal.  Abdominal: There is tenderness in the left lower quadrant. No hernia. Hernia confirmed negative in the ventral area, confirmed negative in the right inguinal area and confirmed negative in the left inguinal area.  obese  Musculoskeletal: Normal range of motion.  Neurological: She is alert and oriented to person, place, and time.  Skin: Skin is warm and dry.  Psychiatric: She has a normal mood and affect. Her behavior is normal. Judgment and thought content normal.    Data Reviewed Dr Seymour Bars office notes  Assessment    Left lower quadrant abdominal pain intermittent for 2 months    Plan    Recommend CT scan for further evaluation and pelvis. No hernia on examination. Further recommendations to follow after imaging done.       Saliha Salts A. 08/31/2013, 3:27 PM

## 2013-09-01 ENCOUNTER — Telehealth (INDEPENDENT_AMBULATORY_CARE_PROVIDER_SITE_OTHER): Payer: Self-pay | Admitting: *Deleted

## 2013-09-01 NOTE — Telephone Encounter (Signed)
Spoke with pt to inform her of her appt at [facility] for her CT scan on 09/03/13 with an arrival time of 12:25.  Gave pt instructions of when to drink contrast as well as to be NPO 4 hours prior to scan.

## 2013-09-03 ENCOUNTER — Ambulatory Visit
Admission: RE | Admit: 2013-09-03 | Discharge: 2013-09-03 | Disposition: A | Discharge status: Home / Self Care | Payer: 59 | Source: Ambulatory Visit | Attending: Surgery | Admitting: Surgery

## 2013-09-03 DIAGNOSIS — R1032 Left lower quadrant pain: Secondary | ICD-10-CM

## 2013-09-03 MED ORDER — IOHEXOL 300 MG/ML  SOLN
125.0000 mL | Freq: Once | INTRAMUSCULAR | Status: AC | PRN
  Administered 2013-09-03: 125 mL via INTRAVENOUS

## 2013-09-04 ENCOUNTER — Telehealth (INDEPENDENT_AMBULATORY_CARE_PROVIDER_SITE_OTHER): Payer: Self-pay

## 2013-09-04 NOTE — Telephone Encounter (Signed)
Message copied by Brennan Bailey on Fri Sep 04, 2013  4:33 PM ------      Message from: Harriette Bouillon A      Created: Thu Sep 03, 2013  3:36 PM       NORMAL.  NO HERNIA. ------

## 2013-09-04 NOTE — Telephone Encounter (Signed)
Called patient and let her know Ct was normal and no hernia was seen.

## 2014-10-12 ENCOUNTER — Ambulatory Visit (INDEPENDENT_AMBULATORY_CARE_PROVIDER_SITE_OTHER): Payer: 59

## 2014-10-12 ENCOUNTER — Ambulatory Visit (INDEPENDENT_AMBULATORY_CARE_PROVIDER_SITE_OTHER): Payer: 59 | Admitting: Family Medicine

## 2014-10-12 VITALS — BP 112/72 | HR 73 | Temp 98.0°F | Resp 18 | Ht 67.25 in | Wt 287.0 lb

## 2014-10-12 DIAGNOSIS — M62838 Other muscle spasm: Secondary | ICD-10-CM

## 2014-10-12 DIAGNOSIS — M25511 Pain in right shoulder: Secondary | ICD-10-CM

## 2014-10-12 MED ORDER — CYCLOBENZAPRINE HCL 10 MG PO TABS: 10.0000 mg | ORAL_TABLET | Freq: Three times a day (TID) | ORAL | Status: DC | PRN

## 2014-10-12 MED ORDER — KETOROLAC TROMETHAMINE 30 MG/ML IJ SOLN
30.0000 mg | Freq: Once | INTRAMUSCULAR | Status: AC
  Administered 2014-10-12: 30 mg via INTRAMUSCULAR

## 2014-10-12 NOTE — Progress Notes (Signed)
Examined pt and agree with note by Mr. McVeigh, PA-C.   Called pt and asked her not to take any further NSAIDs today as she had toradol.

## 2014-10-12 NOTE — Progress Notes (Signed)
   Subjective:    Patient ID: Jamie Turner, female    DOB: [birth year], 33 y.o.   MRN: [id number]  Jamie Turner, Jamie P, MD  Chief Complaint  Patient presents with  . Shoulder Pain    Rt Shoulder pain comes from Rt side of Neck - started today and it gets worse. -[facility]-   Prior to Admission medications   Not on File   Medications, allergies, past medical history, surgical history, family history, social history and problem list reviewed and updated.  HPI  [age] yof with no pertinent PMH presents with right shoulder/neck pain.   Started around 8am this morning. She was sitting at her desk, thinks she may have moved her arm around a bit to stretch, then felt a sudden onset right shoulder pain on the top of the shoulder. It was very mild at first but has been worsening ever since. Initially 3/10, now rated 10/10. She states the pain gets even worse when she tries to move it in any direction. She describes the pain as being located in numerous diff spots across the right shoulder. Starting a couple hrs ago she started to feel pain up the right side of her neck.   She denies any numbness or tingling down right arm. Denies any fever, chills. She raked leaves 3 days ago but otherwise can not think of any contributing events. No trauma.   Review of Systems No CP, SOB, fever ,chills.     Objective:   Physical Exam  Constitutional: She appears well-developed and well-nourished.  Non-toxic appearance. She does not have a sickly appearance. She does not appear ill. No distress.  BP 112/72 mmHg  Pulse 73  Temp(Src) 98 F (36.7 C) (Oral)  Resp 18  Ht 5' 7.25" (1.708 m)  Wt 287 lb (130.182 kg)  BMI 44.62 kg/m2  SpO2 99%   Neck: Normal range of motion. Muscular tenderness present. No spinous process tenderness present. No rigidity. No erythema and normal range of motion present. No Brudzinski's sign noted.  Tenderness right neck with right ear to right shoulder. Otherwise full ROM without  tenderness. Muscle spasm noted at right side neck. Approx 1" in size. TTP.   Musculoskeletal:       Right shoulder: She exhibits decreased range of motion, tenderness, bony tenderness and pain. She exhibits no swelling, no crepitus, no spasm, normal pulse and normal strength.  TTP over top of shoulder, TTP on posterior joint line. Limited ROM. Only able to lift arm to approx 45 degrees in front initially. Neg Neers test, neg yergasons, neg lachman. Able to lift arm overhead during neers test without obvious discomfort. Normal strength at right elbow, wrist. Normal sensation RUE. 2+ right radial pulse. Normal cap refill.    [facility] reading (PRIMARY) by  Dr. Patsy Lageropland. Findings: No acute abnormality.     Assessment & Plan:   [age] yof with no pertinent PMH presents with right shoulder/neck pain.  Acute shoulder pain, right - Plan: DG Shoulder Right, ketorolac (TORADOL) 30 MG/ML injection 30 mg --xray normal --doubt rotator cuff pathology with onset of pain and neg testing --doubt septic arthritis/myositis with normal vitals, neg erythema, swelling, crepitus on exam --toradol --nsaids --rtc 48 hours if not improving  Muscle spasms of neck - Plan: cyclobenzaprine (FLEXERIL) 10 MG tablet --flexeril --could be contributing to right shoulder pain --heat/ice  Donnajean Lopesodd M. Laqueena Hinchey, PA-C Physician Assistant-Certified [facility]  10/12/2014 3:28 PM

## 2014-10-12 NOTE — Patient Instructions (Signed)
Sorry your shoulder is bothering you so much.  Your xray was normal. I do not think you tore your rotator cuff based on how the pain came on and based on your exam today. You have a muscle spasm on the right side of your neck.  Please take the flexeril as needed for the spasm. Remember this may make you sleepy. You got an injection of toradol today. Please take ibuprofen 400-600 mg every six hours as needed for pain. Please apply heat and ice to the shoulder. You can do whichever helps it to feel better.  Please come back to clinic if you are not feeling better in 48 hours.

## 2015-01-22 ENCOUNTER — Ambulatory Visit (INDEPENDENT_AMBULATORY_CARE_PROVIDER_SITE_OTHER): Payer: 59 | Admitting: Emergency Medicine

## 2015-01-22 VITALS — BP 132/78 | HR 80 | Temp 98.2°F | Resp 16 | Ht 67.5 in | Wt 293.8 lb

## 2015-01-22 DIAGNOSIS — J029 Acute pharyngitis, unspecified: Secondary | ICD-10-CM

## 2015-01-22 LAB — POCT RAPID STREP A (OFFICE): RAPID STREP A SCREEN: NEGATIVE

## 2015-01-22 MED ORDER — AMOXICILLIN-POT CLAVULANATE 875-125 MG PO TABS: 1.0000 | ORAL_TABLET | Freq: Two times a day (BID) | ORAL | Status: DC

## 2015-01-22 NOTE — Progress Notes (Signed)
   Subjective:    Patient ID: Jamie Turner, female    DOB: 02/25/1981, 34 y.o.   MRN: [id number]  HPI Chief Complaint  Patient presents with  . Sore Throat    x 2 dys   This chart was scribed for Lesle ChrisSteven Jacie Tristan, MD by Andrew Auaven Small, ED Scribe. This patient was seen in room 1 and the patient's care was started at 1:20 PM.  HPI Comments: Jamie Maatalie W Regner is a 34 y.o. female who presents to the [facility] complaining of a sore throat worse on right than left onset 2 days. Pt states she did a salt water gargle 2 days ago but sore throat worsened the following day.she reports white pus patches on tonsils.  Pt was unable to sleep last night due to sore throat. Pt has had strep in the past. Pt denies drug allergies.   Past Medical History  Diagnosis Date  . Anxiety   . Depression    Past Surgical History  Procedure Laterality Date  . Cholecystectomy    . Dilation and curettage of uterus     Prior to Admission medications   Not on File   Review of Systems  Constitutional: Negative for chills and fatigue.  HENT: Positive for sore throat.        Objective:   Physical Exam CONSTITUTIONAL: Well developed/well nourished HEAD: Normocephalic/atraumatic EYES: EOMI/PERRL ENMT: Mucous membranes moist. large cryptic area. Tonsils  3+ enlarged.  Exudate. Bilateral anterior cervical nodes.  NECK: supple no meningeal signs SPINE/BACK:entire spine nontender CV: S1/S2 noted, no murmurs/rubs/gallops noted LUNGS: Lungs are clear to auscultation bilaterally, no apparent distress ABDOMEN: soft, nontender, no rebound or guarding, bowel sounds noted throughout abdomen GU:no cva tenderness NEURO: Pt is awake/alert/appropriate, moves all extremitiesx4.  No facial droop.   EXTREMITIES: pulses normal/equal, full ROM SKIN: warm, color normal PSYCH: no abnormalities of mood noted, alert and oriented to situation  Results for orders placed or performed in visit on 01/22/15  POCT  rapid strep A  Result Value Ref Range   Rapid Strep A Screen Negative Negative     Assessment & Plan:  Strep test negative culture sent will treat with Augmentin for 10 days.I personally performed the services described in this documentation, which was scribed in my presence. The recorded information has been reviewed and is accurate.

## 2015-01-22 NOTE — Progress Notes (Deleted)
   Subjective:    Patient ID: Jamie Turner, female    DOB: 1981/03/03, 34 y.o.   MRN: [id number]  HPI    Review of Systems     Objective:   Physical Exam        Assessment & Plan:

## 2015-01-24 LAB — CULTURE, GROUP A STREP: ORGANISM ID, BACTERIA: NORMAL

## 2015-01-26 ENCOUNTER — Telehealth: Payer: Self-pay

## 2015-01-26 NOTE — Telephone Encounter (Signed)
Pt states she was seen for a sore throat on Saturday and it hasn't gotten any better, would like to be advised. Please call [phone]    [facility] [place]

## 2015-01-26 NOTE — Telephone Encounter (Signed)
Spoke with pt, she states she is taking the medication as prescribed but she feels like she is not getting better. She states her nurse at work saw her today and she said she could still see the white patches on her tonsils. I advised pt that she is on the best treatment. Advised her to use Tylenol and Ibuprofen for pain. Could this be viral? Please advise.

## 2015-01-27 NOTE — Telephone Encounter (Signed)
Spoke with pt, advised message from Dr. Cleta Albertsaub. She will come in on Friday if she is no better.

## 2015-01-27 NOTE — Telephone Encounter (Signed)
This definitely could be viral. Or she could have a resistant organism. Augmentin kills all of the typical organisms that cause tonsillitis. I would advise she return to clinic either today or I would be happy to see her tomorrow. I work 8-5. We could do blood work at that time.

## 2015-01-28 ENCOUNTER — Ambulatory Visit (INDEPENDENT_AMBULATORY_CARE_PROVIDER_SITE_OTHER): Payer: 59 | Admitting: Emergency Medicine

## 2015-01-28 VITALS — BP 104/66 | HR 109 | Temp 97.9°F | Resp 16 | Ht 67.5 in | Wt 288.2 lb

## 2015-01-28 DIAGNOSIS — J029 Acute pharyngitis, unspecified: Secondary | ICD-10-CM | POA: Diagnosis not present

## 2015-01-28 DIAGNOSIS — L989 Disorder of the skin and subcutaneous tissue, unspecified: Secondary | ICD-10-CM | POA: Diagnosis not present

## 2015-01-28 LAB — POCT CBC
Granulocyte percent: 67.2 %G (ref 37–80)
HEMATOCRIT: 43.6 % (ref 37.7–47.9)
HEMOGLOBIN: 14.6 g/dL (ref 12.2–16.2)
LYMPH, POC: 1.1 (ref 0.6–3.4)
MCH, POC: 28.8 pg (ref 27–31.2)
MCHC: 33.5 g/dL (ref 31.8–35.4)
MCV: 86.1 fL (ref 80–97)
MID (cbc): 0.5 (ref 0–0.9)
MPV: 6.7 fL (ref 0–99.8)
PLATELET COUNT, POC: 302 10*3/uL (ref 142–424)
POC GRANULOCYTE: 3.4 (ref 2–6.9)
POC LYMPH PERCENT: 22.1 %L (ref 10–50)
POC MID %: 10.7 %M (ref 0–12)
RBC: 5.06 M/uL (ref 4.04–5.48)
RDW, POC: 13.4 %
WBC: 5.1 10*3/uL (ref 4.6–10.2)

## 2015-01-28 MED ORDER — HYDROCODONE-ACETAMINOPHEN 5-325 MG PO TABS: 1.0000 | ORAL_TABLET | Freq: Four times a day (QID) | ORAL | Status: DC | PRN

## 2015-01-28 MED ORDER — FIRST-DUKES MOUTHWASH MT SUSP: OROMUCOSAL | Status: DC

## 2015-01-28 NOTE — Progress Notes (Addendum)
   Subjective:    Patient ID: Jamie Turner, female    DOB: March 14, 1981, 34 y.o.   MRN: [id number] This chart was scribed for Lesle ChrisSteven Simran Bomkamp, MD by Littie Deedsichard Sun, Medical Scribe. This patient was seen in room 5 and the patient's care was started at 10:30 AM.   HPI HPI Comments: Jamie Maatalie W Zeek is a 34 y.o. female who presents to the [facility] for a follow-up for acute pharyngitis. She was seen by me on 2/27. She was put on Augmentin, but she states her symptoms have not improved. Patient has been treating her sore throat with saltwater gargles, which have been providing temporary relief. She describes the sore throat as a prickly feeling as if she "swallowed a cactus." However, after the gargles, she feels as if the prickly sensation goes up to her mouth. She reports having canker sores in her mouth. Her symptoms are worsened with loud talking, and she states she will lose her voice if she talks too loud. Patient also notes that a gland on the right side of her face is still swollen and that she also has a knot on the back of her neck now. Her husband has also been sick with similar symptoms; he also has canker sores in his mouth. Patient denies fever.    Review of Systems  Constitutional: Negative for fever.  HENT: Positive for mouth sores, sore throat and voice change.        Objective:   Physical Exam CONSTITUTIONAL: Well developed/well nourished HEAD: Normocephalic/atraumatic EYES: EOM/PERRL ENMT: Tonsils 3+ enlarged and cryptic in appearance. NECK: Tender anterior cervical and posterior cervical nodes. SPINE: entire spine nontender CV: S1/S2 noted, no murmurs/rubs/gallops noted LUNGS: Lungs are clear to auscultation bilaterally, no apparent distress ABDOMEN: soft, nontender, no rebound or guarding GU: no cva tenderness NEURO: Pt is awake/alert, moves all extremitiesx4 EXTREMITIES: pulses normal, full ROM SKIN: warm, color normal PSYCH: no abnormalities of  mood noted  Results for orders placed or performed in visit on 01/28/15  POCT CBC  Result Value Ref Range   WBC 5.1 4.6 - 10.2 K/uL   Lymph, poc 1.1 0.6 - 3.4   POC LYMPH PERCENT 22.1 10 - 50 %L   MID (cbc) 0.5 0 - 0.9   POC MID % 10.7 0 - 12 %M   POC Granulocyte 3.4 2 - 6.9   Granulocyte percent 67.2 37 - 80 %G   RBC 5.06 4.04 - 5.48 M/uL   Hemoglobin 14.6 12.2 - 16.2 g/dL   HCT, POC 32.943.6 51.837.7 - 47.9 %   MCV 86.1 80 - 97 fL   MCH, POC 28.8 27 - 31.2 pg   MCHC 33.5 31.8 - 35.4 g/dL   RDW, POC 84.113.4 %   Platelet Count, POC 302 142 - 424 K/uL   MPV 6.7 0 - 99.8 fL        Assessment & Plan:  We'll treat symptomatically with pain relief and gargles Viral culture  was done. She is currently on Augmentin and will finish this out. I will go ahead and do a herpetic culture. Her husband has the same illness.I personally performed the services described in this documentation, which was scribed in my presence. The recorded information has been reviewed and is accurate.

## 2015-01-28 NOTE — Patient Instructions (Signed)

## 2015-01-30 ENCOUNTER — Telehealth: Payer: Self-pay

## 2015-01-30 NOTE — Telephone Encounter (Signed)
Patient was seen within the last few weeks and states she now has a rash all over body. She does not want to be seen but want to know what to do. Cb# [phone]

## 2015-01-30 NOTE — Telephone Encounter (Signed)
Looks like she's been on Augmentin.   Dr. Ellis Parentsaub's notes reviewed. Initially seen 2/27, when antibiotic prescribed for sore throat. Rapid strep test was negative. Culture also negative. Returned 3/04 for re-evaluation due to persistent symptoms. Advised to complete course of antibiotic. HSV culture is pending.  She's had about 7 days' of the 10-day course. Agree with the advice to stop the Augmentin and take oral antihistamine, return if symptoms persist.

## 2015-01-30 NOTE — Telephone Encounter (Signed)
Pt is currently on Amox. Advised to stop and to try Benadryl/Claritin/Zyrtec. She does not want to come back in. Gave precautions and advised that if it gets worse/not any better, to RTC

## 2015-01-31 LAB — HERPES SIMPLEX VIRUS CULTURE: ORGANISM ID, BACTERIA: NOT DETECTED

## 2015-03-08 IMAGING — CR DG SHOULDER 2+V*R*
4 series · 4 of 4 positions shown · non-contrast
Comparison: None.

CLINICAL DATA: Acute right shoulder pain.  Initial encounter.

EXAM:
RIGHT SHOULDER - 2+ VIEW

[AP (1 of 3)]
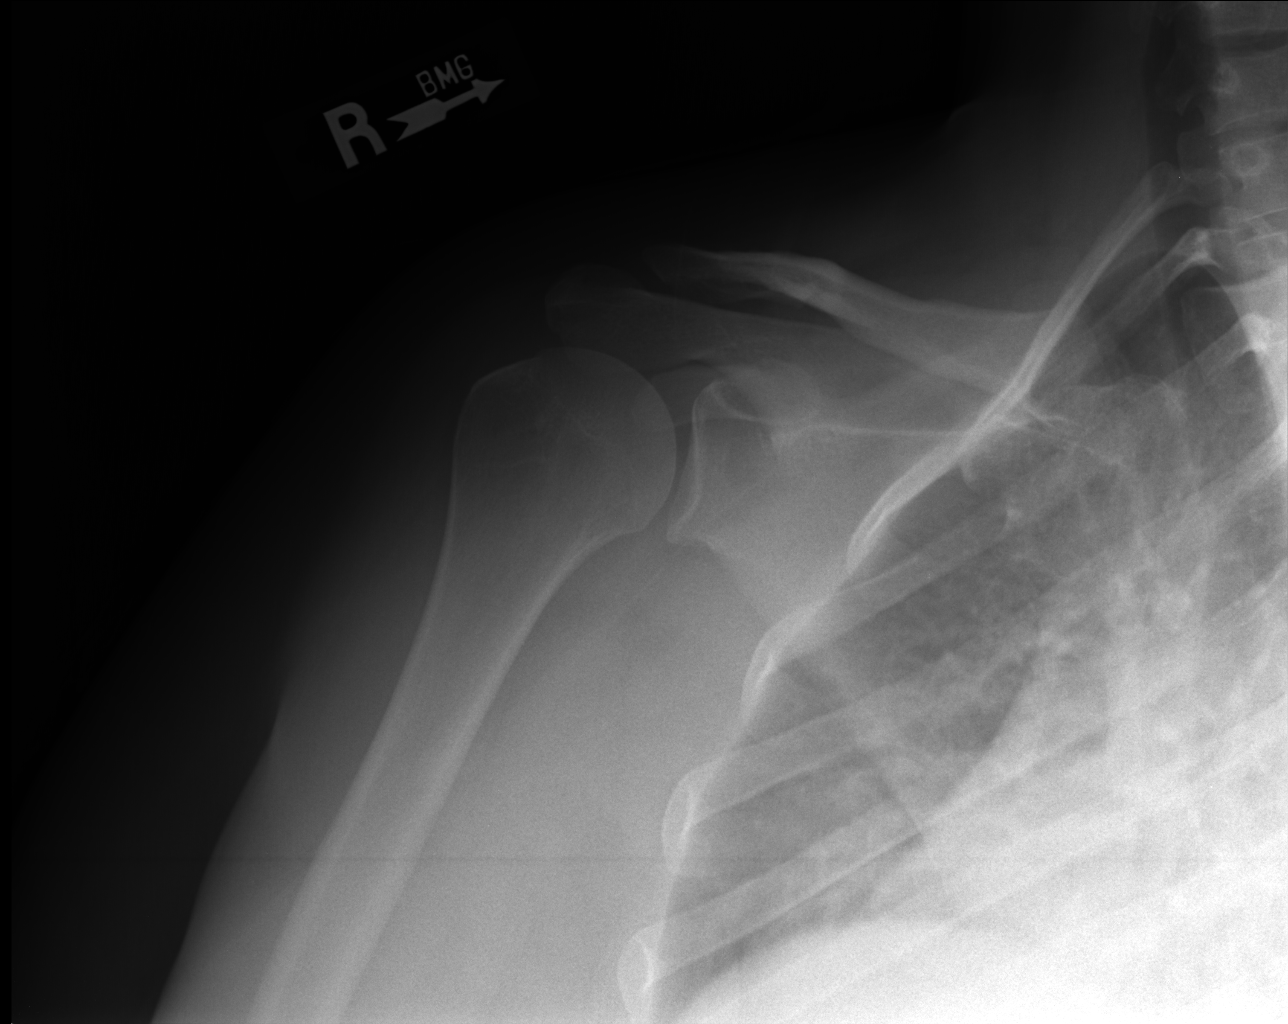

[AP (2 of 3)]
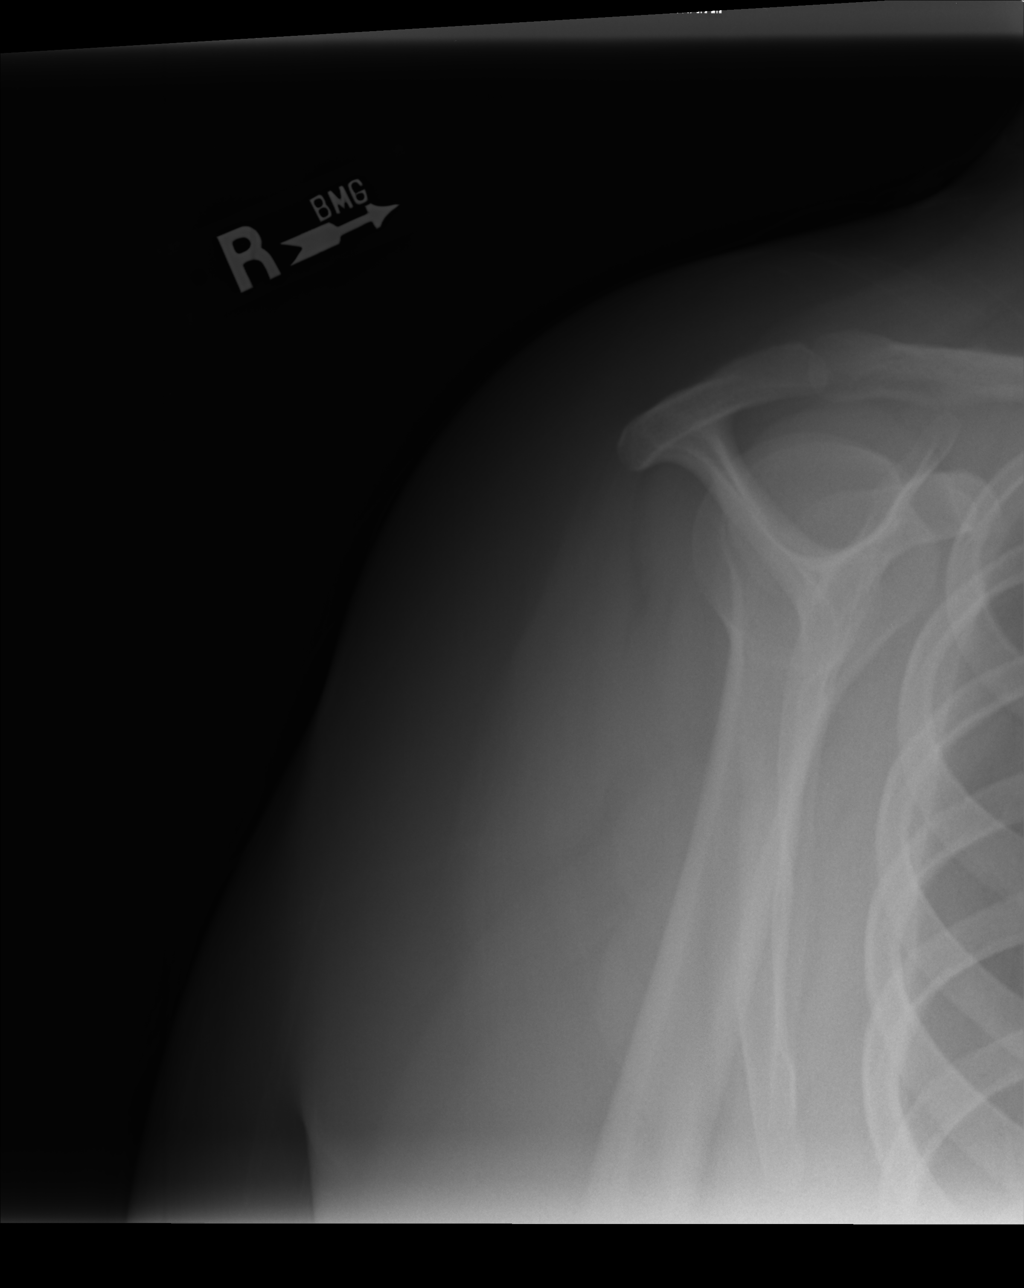

[axial]
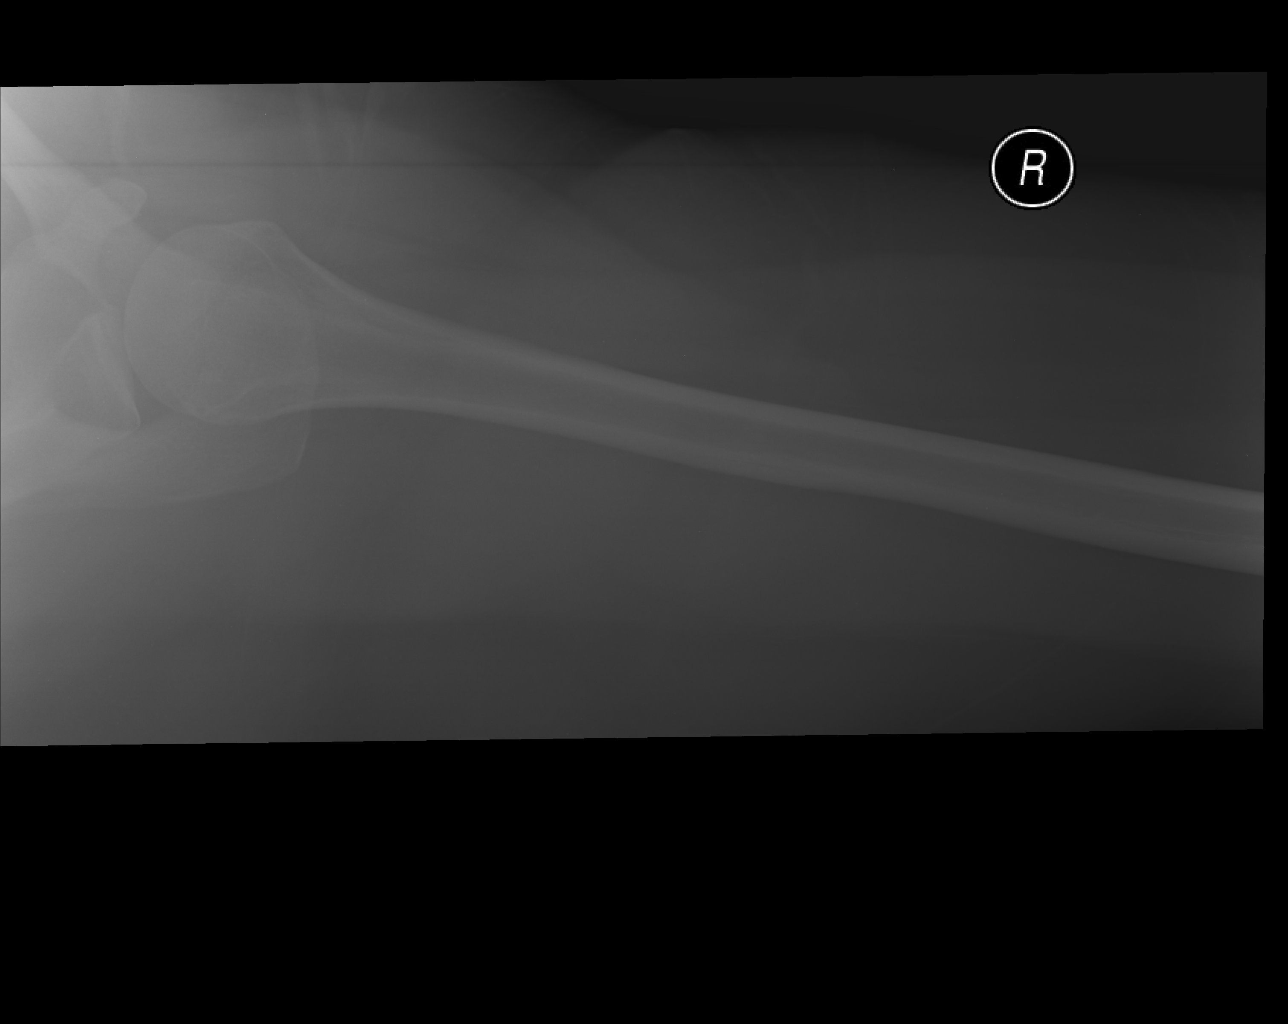

[AP (3 of 3)]
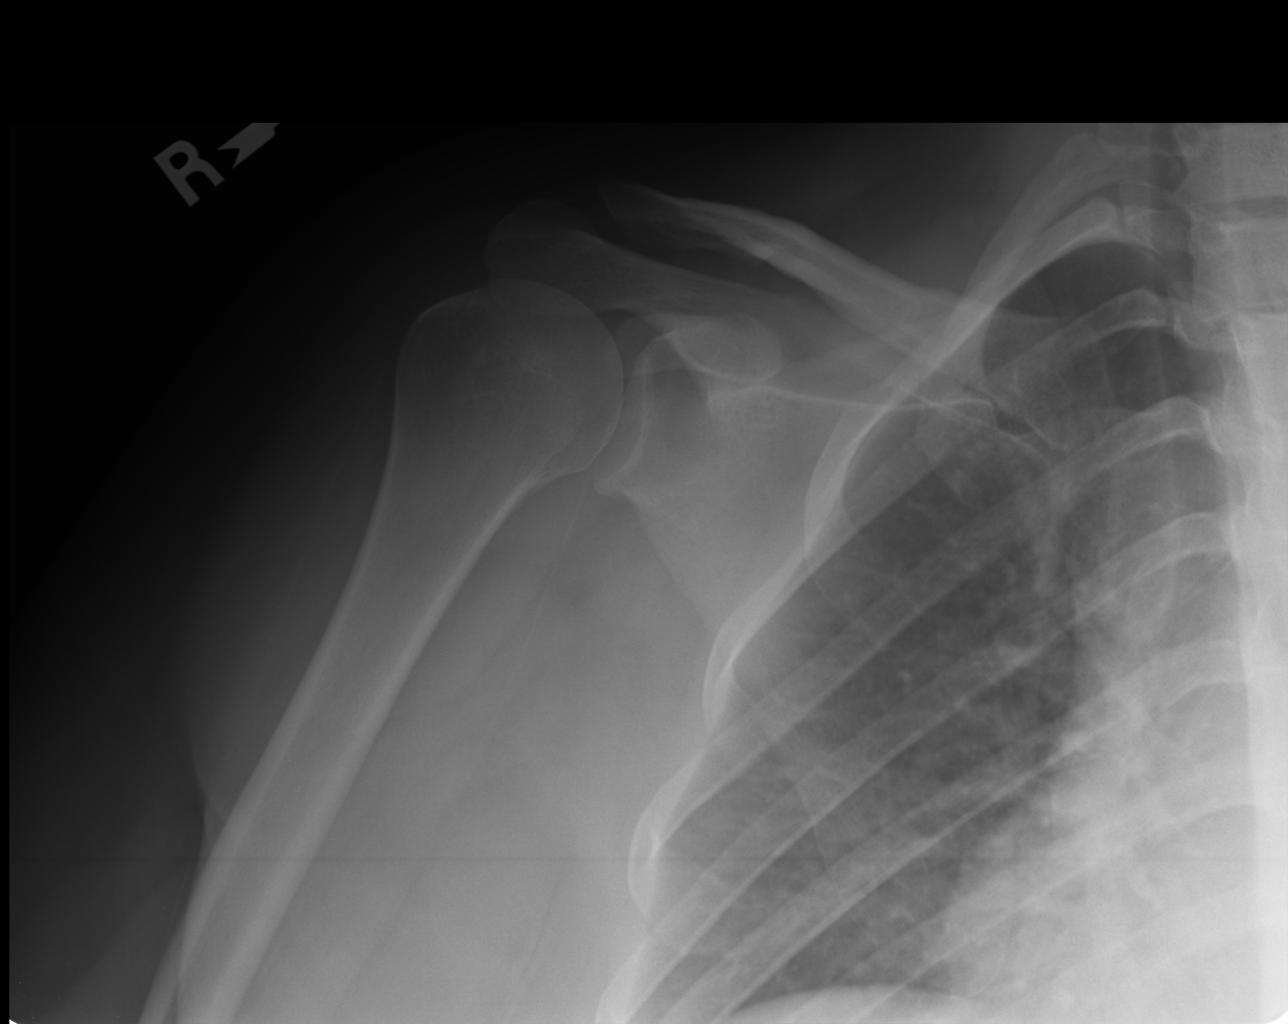

[4 of 4 positions shown; findings below may reference images not displayed]

FINDINGS: There is no evidence of fracture or dislocation. There is no
evidence of arthropathy or other focal bone abnormality. Soft
tissues are unremarkable.
IMPRESSION: Negative.

## 2015-10-24 ENCOUNTER — Encounter: Payer: Self-pay | Admitting: Internal Medicine

## 2016-02-14 ENCOUNTER — Ambulatory Visit (INDEPENDENT_AMBULATORY_CARE_PROVIDER_SITE_OTHER): Payer: 59 | Admitting: Family Medicine

## 2016-02-14 VITALS — BP 126/82 | HR 91 | Temp 99.8°F | Resp 20 | Ht 67.5 in | Wt 274.2 lb

## 2016-02-14 DIAGNOSIS — J111 Influenza due to unidentified influenza virus with other respiratory manifestations: Secondary | ICD-10-CM | POA: Diagnosis not present

## 2016-02-14 MED ORDER — OSELTAMIVIR PHOSPHATE 75 MG PO CAPS: 75.0000 mg | ORAL_CAPSULE | Freq: Two times a day (BID) | ORAL | Status: DC

## 2016-02-14 MED ORDER — ACETAMINOPHEN-CODEINE #3 300-30 MG PO TABS: 1.0000 | ORAL_TABLET | Freq: Three times a day (TID) | ORAL | Status: DC | PRN

## 2016-02-14 NOTE — Progress Notes (Signed)
  Subjective:     Jamie Turner is a 35 y.o. female who presents for evaluation of symptoms of a URI. Symptoms include fever up to 101, headache described as tightness in head, not worst HA of life and non productive cough. Onset of symptoms was 1 days ago, and has been unchanged since that time. Treatment to date: none.  Denies nausea, vomiting, dizziness, blurred vision, SOB/CP.  Denies smoking, no recent travel, did not get flu shot.    The following portions of the patient's history were reviewed and updated as appropriate: allergies, current medications, past family history, past medical history, past social history, past surgical history and problem list.  Review of Systems Pertinent items are noted in HPI.   Objective:    BP 126/82 mmHg  Pulse 91  Temp(Src) 99.8 F (37.7 C) (Oral)  Resp 20  Ht 5' 7.5" (1.715 m)  Wt 274 lb 3.2 oz (124.376 kg)  BMI 42.29 kg/m2  SpO2 95% General appearance: alert and cooperative Head: Normocephalic, without obvious abnormality, atraumatic Lungs: clear to auscultation bilaterally Heart: regular rate and rhythm Abdomen: soft, non-tender; bowel sounds normal; no masses,  no organomegaly   Assessment:    Probable influenza   Plan:    Suggested symptomatic OTC remedies. Nasal saline spray for congestion. Follow up as needed. Tamiflu since < 48 hours.  As well, tylenol #3 for cough and recommend mucinex.

## 2016-02-14 NOTE — Patient Instructions (Signed)
     IF you received an x-ray today, you will receive an invoice from [facility]. Please contact [facility] at [phone] with questions or concerns regarding your invoice.   IF you received labwork today, you will receive an invoice from [facility]. Please contact [facility] at [phone] with questions or concerns regarding your invoice.   Our billing staff will not be able to assist you with questions regarding bills from these companies.  You will be contacted with the lab results as soon as they are available. The fastest way to get your results is to activate your My Chart account. Instructions are located on the last page of this paperwork. If you have not heard from us regarding the results in 2 weeks, please contact this office.      

## 2016-04-06 ENCOUNTER — Ambulatory Visit (INDEPENDENT_AMBULATORY_CARE_PROVIDER_SITE_OTHER): Payer: 59 | Admitting: Physician Assistant

## 2016-04-06 VITALS — BP 118/70 | HR 78 | Temp 98.2°F | Resp 16 | Ht 67.5 in | Wt 277.2 lb

## 2016-04-06 DIAGNOSIS — B001 Herpesviral vesicular dermatitis: Secondary | ICD-10-CM | POA: Diagnosis not present

## 2016-04-06 DIAGNOSIS — Z207 Contact with and (suspected) exposure to pediculosis, acariasis and other infestations: Secondary | ICD-10-CM

## 2016-04-06 DIAGNOSIS — Z2089 Contact with and (suspected) exposure to other communicable diseases: Secondary | ICD-10-CM

## 2016-04-06 DIAGNOSIS — J209 Acute bronchitis, unspecified: Secondary | ICD-10-CM

## 2016-04-06 DIAGNOSIS — L500 Allergic urticaria: Secondary | ICD-10-CM | POA: Diagnosis not present

## 2016-04-06 MED ORDER — VALACYCLOVIR HCL 1 G PO TABS: ORAL_TABLET | ORAL | Status: AC

## 2016-04-06 MED ORDER — RANITIDINE HCL 150 MG PO TABS: 150.0000 mg | ORAL_TABLET | Freq: Every day | ORAL | Status: AC

## 2016-04-06 MED ORDER — AZITHROMYCIN 250 MG PO TABS: ORAL_TABLET | ORAL | Status: AC

## 2016-04-06 MED ORDER — PERMETHRIN 1 % EX LOTN: 1.0000 "application " | TOPICAL_LOTION | Freq: Once | CUTANEOUS | Status: DC

## 2016-04-06 MED ORDER — PERMETHRIN 1 % EX LOTN: 1.0000 "application " | TOPICAL_LOTION | Freq: Once | CUTANEOUS | Status: AC

## 2016-04-06 MED ORDER — ACETAMINOPHEN-CODEINE #3 300-30 MG PO TABS
1.0000 | ORAL_TABLET | Freq: Every evening | ORAL | Status: AC | PRN
Start: 2016-04-06 — End: ?

## 2016-04-06 MED ORDER — CETIRIZINE HCL 10 MG PO TABS: 10.0000 mg | ORAL_TABLET | Freq: Every day | ORAL | Status: AC

## 2016-04-06 NOTE — Patient Instructions (Addendum)
Use antibiotic as directed for your cough May take tylenol with codeine at night to help you sleep as needed. Take zantac in the AM and zyrtec in the PM to shut down your allergic reaction If doesn't go away -- keep a diary to try to figure out what you are allergic to May use permethrin lotion once -- see directions below   Topical: Cream rinse/lotion 1%: Prior to application, wash hair with conditioner-free shampoo; rinse with water and towel dry. Apply a sufficient amount of lotion or cream rinse to saturate the hair and scalp (especially behind the ears and nape of neck). Leave on hair for no longer than 10 minutes, then rinse off with warm water; remove remaining nits with nit comb. A single application is generally sufficient; however may repeat 7 days after first treatment if lice or nits are still present.      IF you received an x-ray today, you will receive an invoice from [facility]. Please contact [facility] at [phone] with questions or concerns regarding your invoice.   IF you received labwork today, you will receive an invoice from [facility]. Please contact [facility] at [phone] with questions or concerns regarding your invoice.   Our billing staff will not be able to assist you with questions regarding bills from these companies.  You will be contacted with the lab results as soon as they are available. The fastest way to get your results is to activate your My Chart account. Instructions are located on the last page of this paperwork. If you have not heard from us regarding the results in 2 weeks, please contact this office.

## 2016-04-06 NOTE — Progress Notes (Signed)
[facility] [address], [place] [address] [phone]  Date:  04/06/2016   Name:  Jamie Turner   DOB:  1981-11-19   MRN:  [id number]  PCP:  [name], ROBERT P, MD    Chief Complaint: Cough; Rash; and Sore Throat   History of Present Illness:  This is a 35 y.o. female who is presenting several complaints today.  Complains of cough, mix of dry and productive, x 2 weeks. Cough is staying the same since onset. She is very tired but otherwise is feeling ok. Denies sob, wheezing, fever, chills. She had a bad cough a couple months ago with the flu. She eventually got better, tylenol #3 was prescribed and that helped. She took a few leftovers this time and helped some.  Complaining of mouth ulcer since yesterday. On the roof of her mouth. Burning. 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  Also complaining of intermittent rash x 1 week. States it has been occurring at night around 6 pm. Come and goes. Moves. Sometimes on legs, sometimes on arms, sometimes on back. Not currently with rash but does have pictures on her phone. No new exposures as far as she knows. She has no known allergies. She has been taking benadryl and helping some.  She is also stating she is having a hard time getting rid of lice with her kids. She is doing home remedies and OTC meds currently. She is wondering what she can do for them and she is wanting to do a treatment herself as well.  Review of Systems:  Review of Systems See HPI  There are no active problems to display for this patient.   Prior to Admission medications   Not on File    No Known Allergies  Past Surgical History  Procedure Laterality Date  . Cholecystectomy    . Dilation and curettage of uterus      Social History  Substance Use Topics  . Smoking status: Never Smoker   . Smokeless tobacco: None  . Alcohol Use: 0.0 oz/week    0 Standard drinks or equivalent per week      Comment: occassionally    History reviewed. No pertinent family history.  Medication list has been reviewed and updated.  Physical Examination:  Physical Exam  Constitutional: She is oriented to person, place, and time. She appears well-developed and well-nourished. No distress.  HENT:  Head: Normocephalic and atraumatic.  Right Ear: Hearing, tympanic membrane, external ear and ear canal normal.  Left Ear: Hearing, tympanic membrane, external ear and ear canal normal.  Nose: Nose normal.  Mouth/Throat: Uvula is midline and mucous membranes are normal. No oropharyngeal exudate, posterior oropharyngeal edema or posterior oropharyngeal erythema.  Roof of mouth with 1 cm ulceration  Eyes: Conjunctivae and lids are normal. Right eye exhibits no discharge. Left eye exhibits no discharge. No scleral icterus.  Cardiovascular: Normal rate, regular rhythm, normal heart sounds and normal pulses.   No murmur heard. Pulmonary/Chest: Effort normal and breath sounds normal. No respiratory distress. She has no wheezes. She has no rhonchi. She has no rales.  Musculoskeletal: Normal range of motion.  Neurological: She is alert and oriented to person, place, and time.  Skin: Skin is warm, dry and intact.  Dermatographism present  Psychiatric: She has a normal mood and affect. Her speech is normal and behavior is normal. Thought content normal.   BP 118/70 mmHg  Pulse 78  Temp(Src) 98.2 F (36.8 C) (Oral)  Resp 16  Ht 5' 7.5" (1.715 m)  Wt 277 lb 3.2 oz (125.737 kg)  BMI 42.75 kg/m2  SpO2 98%  Assessment and Plan:  1. Acute bronchitis, unspecified organism - azithromycin (ZITHROMAX) 250 MG tablet; Take 2 tabs PO x 1 dose, then 1 tab PO QD x 4 days  Dispense: 6 tablet; Refill: 0 - acetaminophen-codeine (TYLENOL #3) 300-30 MG tablet; Take 1-2 tablets by mouth at bedtime as needed for moderate pain.  Dispense: 8 tablet; Refill: 0  2. Allergic urticaria Pictures on her rash on her phone  consistent with urticaria. Dermatographism + . Treat with zyrtec and zantac for next 2 weeks. Keep a diary to try to determine a cause, esp if not getting better after 2 weeks of zytrec and zantac. - cetirizine (ZYRTEC) 10 MG tablet; Take 1 tablet (10 mg total) by mouth daily.  Dispense: 30 tablet; Refill: 0 - ranitidine (ZANTAC) 150 MG tablet; Take 1 tablet (150 mg total) by mouth daily.  Dispense: 30 tablet; Refill: 0  3. Exposure to head lice - permethrin (PERMETHRIN LICE TREATMENT) 1 % lotion; Apply 1 application topically once. Shampoo, rinse and towel dry hair, saturate hair and scalp with permethrin. Rinse after 10 min; repeat in 1 week if needed  Dispense: 59 mL; Refill: 0  4. Cold sore - valACYclovir (VALTREX) 1000 MG tablet; Take 2 tabs (2 gm) po, repeat in 12 hours.  Dispense: 8 tablet; Refill: 0   [employer] [employer]. Dyke Brackett, MHS [facility]  04/06/2016

## 2016-04-16 ENCOUNTER — Ambulatory Visit (INDEPENDENT_AMBULATORY_CARE_PROVIDER_SITE_OTHER): Payer: 59 | Admitting: Family Medicine

## 2016-04-16 VITALS — BP 112/82 | HR 72 | Temp 98.6°F | Resp 16 | Wt 269.0 lb

## 2016-04-16 DIAGNOSIS — R062 Wheezing: Secondary | ICD-10-CM | POA: Diagnosis not present

## 2016-04-16 DIAGNOSIS — R05 Cough: Secondary | ICD-10-CM | POA: Diagnosis not present

## 2016-04-16 DIAGNOSIS — R059 Cough, unspecified: Secondary | ICD-10-CM

## 2016-04-16 DIAGNOSIS — B850 Pediculosis due to Pediculus humanus capitis: Secondary | ICD-10-CM | POA: Diagnosis not present

## 2016-04-16 MED ORDER — ALBUTEROL SULFATE HFA 108 (90 BASE) MCG/ACT IN AERS: 2.0000 | INHALATION_SPRAY | Freq: Four times a day (QID) | RESPIRATORY_TRACT | Status: AC | PRN

## 2016-04-16 MED ORDER — MALATHION 0.5 % EX LOTN: TOPICAL_LOTION | Freq: Once | CUTANEOUS | Status: AC

## 2016-04-16 NOTE — Patient Instructions (Addendum)
IF you received an x-ray today, you will receive an invoice from [facility]. Please contact [facility] at [phone] with questions or concerns regarding your invoice.   IF you received labwork today, you will receive an invoice from [facility]. Please contact [facility] at [phone] with questions or concerns regarding your invoice.   Our billing staff will not be able to assist you with questions regarding bills from these companies.  You will be contacted with the lab results as soon as they are available. The fastest way to get your results is to activate your My Chart account. Instructions are located on the last page of this paperwork. If you have not heard from us regarding the results in 2 weeks, please contact this office.    Malathion skin lotion What is this medicine? MALATHION (mal uh THAHY on) skin lotion is used to treat lice of the hair and scalp. It acts by destroying the lice and their eggs. This medicine may be used for other purposes; ask your health care provider or pharmacist if you have questions. What should I tell my health care provider before I take this medicine? They need to know if you have any of the following conditions: -asthma -rashes or open sores on the skin -an unusual or allergic reaction to malathion, alcohol, pine or pine scented products, veterinary or household insecticides, other medicines, foods, dyes, or preservatives -breast feeding -pregnant or trying to get pregnant How should I use this medicine? This medicine is for external use only. It is a poison if it is swallowed. Do not take by mouth. Follow the directions on the prescription label. If you are applying this medicine to another person, wear disposable gloves to protect yourself from lice infestation. Shake well. Apply to dry hair. Leave on for 8 to 12 hours, rinse, and wash with a non-medicated shampoo to remove dead lice. Keep this  lotion away from your eyes. If you accidentally get some in your eyes, rinse your eyes with cool water right away. Seek medical help if the eyes are hurting. Allow hair to dry naturally. This medicine contains alcohol which is flammable. Do not use hair dryers or curling irons. Do not smoke or go near open flames or electric heat sources. Keep hair uncovered. Comb each small section of hair with a clean, fine toothed comb to remove any leftover nits (eggs) or nit shells. Do not use this medicine more often than directed. Talk to your pediatrician regarding the use of this medicine in children. Special care may be needed. While this medicine may be prescribed for children as young as 6 years for selected conditions, precautions do apply. Overdosage: If you think you have taken too much of this medicine contact a poison control center or emergency room at once. NOTE: This medicine is only for you. Do not share this medicine with others. What if I miss a dose? This does not apply. This medicine is normally used as a single dose. What may interact with this medicine? Interactions are not expected. Do not use any other skin products on the affected area without asking your doctor or health care professional. This list may not describe all possible interactions. Give your health care provider a list of all the medicines, herbs, non-prescription drugs, or dietary supplements you use. Also tell them if you smoke, drink alcohol, or use illegal drugs. Some items may interact with your medicine. What should I watch for while using this medicine?  This medicine is used as a single application treatment. Itching may occur for several days after treatment. This does not mean that the treatment has failed. If live lice are observed in 7 to 9 days after initial application, a second treatment may be needed. Contact your doctor before applying a second treatment. If skin irritation occurs, wash scalp and hair immediately.  If the irritation clears, the lotion may be reapplied. If irritation recurs or continues, consult your doctor or health care professional. Slight stinging sensations may occur while using this lotion. All recently worn clothing, underwear, pajamas, used sheets, pillowcases, and towels should be washed in very hot water or dry-cleaned. Close personal contact can spread the infestation. Family members and sexual contacts may also require treatment for lice. What side effects may I notice from receiving this medicine? Side effects that you should report to your doctor or other health care professional as soon as possible: -allergic reactions like skin rash, itching or hives, swelling of the face, lips, or tongue -breathing problems -redness or irritation of the eyes -severe skin irritation or burning Side effects that usually do not require medical attention (report to your doctor or health care professional if they continue or are bothersome): -itching that continues beyond 7 days -mild skin irritation -redness of the scalp -stinging of the scalp -tingling sensation This list may not describe all possible side effects. Call your doctor for medical advice about side effects. You may report side effects to FDA at 1-800-FDA-1088. Where should I keep my medicine? Keep out of the reach of children and pets. Malathion is a poison if swallowed. Contact a poison control center immediately and seek medical attention should this medicine be swallowed. Store at room temperature between 20 and 25 degrees C (68 and 77 degrees F). Do not freeze. This medicine is flammable. Avoid exposure to heat, fire, flame, and smoking. After treatment, throw away any unused medicine in a container that cannot be reached by children or pets. NOTE: This sheet is a summary. It may not cover all possible information. If you have questions about this medicine, talk to your doctor, pharmacist, or health care provider.    2016,  Elsevier/Gold Standard. (2010-11-09 10:42:17) Cough, Adult Coughing is a reflex that clears your throat and your airways. Coughing helps to heal and protect your lungs. It is normal to cough occasionally, but a cough that happens with other symptoms or lasts a long time may be a sign of a condition that needs treatment. A cough may last only 2-3 weeks (acute), or it may last longer than 8 weeks (chronic). CAUSES Coughing is commonly caused by:  Breathing in substances that irritate your lungs.  A viral or bacterial respiratory infection.  Allergies.  Asthma.  Postnasal drip.  Smoking.  Acid backing up from the stomach into the esophagus (gastroesophageal reflux).  Certain medicines.  Chronic lung problems, including COPD (or rarely, lung cancer).  Other medical conditions such as heart failure. HOME CARE INSTRUCTIONS  Pay attention to any changes in your symptoms. Take these actions to help with your discomfort:  Take medicines only as told by your health care provider.  If you were prescribed an antibiotic medicine, take it as told by your health care provider. Do not stop taking the antibiotic even if you start to feel better.  Talk with your health care provider before you take a cough suppressant medicine.  Drink enough fluid to keep your urine clear or pale yellow.  If the air is  dry, use a cold steam vaporizer or humidifier in your bedroom or your home to help loosen secretions.  Avoid anything that causes you to cough at work or at home.  If your cough is worse at night, try sleeping in a semi-upright position.  Avoid cigarette smoke. If you smoke, quit smoking. If you need help quitting, ask your health care provider.  Avoid caffeine.  Avoid alcohol.  Rest as needed. SEEK MEDICAL CARE IF:   You have new symptoms.  You cough up pus.  Your cough does not get better after 2-3 weeks, or your cough gets worse.  You cannot control your cough with suppressant  medicines and you are losing sleep.  You develop pain that is getting worse or pain that is not controlled with pain medicines.  You have a fever.  You have unexplained weight loss.  You have night sweats. SEEK IMMEDIATE MEDICAL CARE IF:  You cough up blood.  You have difficulty breathing.  Your heartbeat is very fast.   This information is not intended to replace advice given to you by your health care provider. Make sure you discuss any questions you have with your health care provider.   Document Released: 05/11/2011 Document Revised: 08/03/2015 Document Reviewed: 01/19/2015 Elsevier Interactive Patient Education [employer]

## 2016-04-16 NOTE — Progress Notes (Signed)
[facility] [address] [place], [place] [address] [phone]  Date:  04/16/2016   Name:  SEE BEHARRY   DOB:  October 23, 1981   MRN:  [id number]  PCP:  Tonye Pearson, MD    Chief Complaint: Follow-up   History of Present Illness:  FAREEHA EVON is a 35 y.o. very pleasant female patient who presents with the following: Cough x 3 weeks: - Small amount of mucous, no blood - fits of cough, with overflow incontinence and increased back pain - Seen last week, z-pak and codeine qHS.   - No systemic symptoms - History of asthma during pregnancy.  - No smoking history.   Rash has improved significantly with zyrtec and zantac. Now has stopped.  Feeling better.   Lice improved for her, but kids have ongoing issues.     There are no active problems to display for this patient.   Past Medical History  Diagnosis Date  . Anxiety   . Depression     Past Surgical History  Procedure Laterality Date  . Cholecystectomy    . Dilation and curettage of uterus      Social History  Substance Use Topics  . Smoking status: Never Smoker   . Smokeless tobacco: None  . Alcohol Use: 0.0 oz/week    0 Standard drinks or equivalent per week     Comment: occassionally    History reviewed. No pertinent family history.  No Known Allergies  Medication list has been reviewed and updated.  Current Outpatient Prescriptions on File Prior to Visit  Medication Sig Dispense Refill  . acetaminophen-codeine (TYLENOL #3) 300-30 MG tablet Take 1-2 tablets by mouth at bedtime as needed for moderate pain. 8 tablet 0  . cetirizine (ZYRTEC) 10 MG tablet Take 1 tablet (10 mg total) by mouth daily. 30 tablet 0  . ranitidine (ZANTAC) 150 MG tablet Take 1 tablet (150 mg total) by mouth daily. 30 tablet 0  . permethrin (PERMETHRIN LICE TREATMENT) 1 % lotion Apply 1 application topically once. Shampoo, rinse and towel dry hair,  saturate hair and scalp with permethrin. Rinse after 10 min; repeat in 1 week if needed 59 mL 0  . valACYclovir (VALTREX) 1000 MG tablet Take 2 tabs (2 gm) po, repeat in 12 hours. (Patient not taking: Reported on 04/16/2016) 8 tablet 0   No current facility-administered medications on file prior to visit.    Review of Systems:  Review of Systems  Constitutional: Negative for fever, chills and malaise/fatigue.  HENT: Negative for congestion, ear pain, nosebleeds and sore throat.   Respiratory: Positive for cough and shortness of breath. Negative for hemoptysis, sputum production and wheezing.   Cardiovascular: Negative for chest pain and palpitations.  Gastrointestinal: Negative for nausea, vomiting, abdominal pain, diarrhea and constipation.  Genitourinary: Negative for dysuria.  Skin: Negative for itching (resolved).  Neurological: Negative for dizziness.    Physical Examination: Filed Vitals:   04/16/16 0819  BP: 112/82  Pulse: 72  Temp: 98.6 F (37 C)  Resp: 16   Filed Vitals:   04/16/16 0819  Weight: 269 lb (122.018 kg)   Body mass index is 41.49 kg/(m^2). Ideal Body Weight:    GEN: WDWN, NAD, Non-toxic, A & O x 3 HEENT: Atraumatic, Normocephalic. Neck supple. No masses, No LAD. Ears and Nose: No external deformity. CV: RRR, No M/G/R. No JVD. No thrill. No extra heart sounds. PULM: No crackles, rhonchi. Intermittent  wheeze. No retractions. No resp. distress. No accessory muscle use. ABD: S, NT, ND, +BS. No rebound. No HSM. EXTR: No c/c/e NEURO Normal gait.  PSYCH: Normally interactive. Conversant. Not depressed or anxious appearing.  Calm demeanor.   Assessment and Plan: Cough x 3 weeks: Refractory to Z-pak.  Hx of mild asthma.  Intermittent wheeze on exam. No systemic symptoms.  Will trial proair.  F/u 2 weeks if not improving.  Do not use proair long term without further evaluation.  Call with questions.    Lice: refractory to OTC treatments (permethrin). Will  prescribe malathion.  Repeat if necessary.  Also prescribing for remainder of family.  Discussed precautions.  Provided handout. Discussed inflammatory nature.     Signed Guinevere ScarletBlake Karstyn Birkey, MD

## 2016-11-15 ENCOUNTER — Other Ambulatory Visit
Admission: RE | Admit: 2016-11-15 | Discharge: 2016-11-15 | Disposition: A | Discharge status: Home / Self Care | Payer: No Typology Code available for payment source | Source: Ambulatory Visit | Attending: Gastroenterology | Admitting: Gastroenterology

## 2016-11-15 LAB — COMPREHENSIVE METABOLIC PANEL
ALT: 28 U/L (ref 0–55)
AST (SGOT): 23 U/L (ref 10–42)
Albumin/Globulin Ratio: 0.92 Ratio (ref 0.70–1.50)
Albumin: 3.5 gm/dL (ref 3.5–5.0)
Alkaline Phosphatase: 67 U/L (ref 40–145)
Anion Gap: 14.7 mMol/L (ref 7.0–18.0)
BUN / Creatinine Ratio: 17.7 Ratio (ref 10.0–30.0)
BUN: 14 mg/dL (ref 7–22)
Bilirubin, Total: 0.9 mg/dL (ref 0.1–1.2)
CO2: 27.9 mMol/L (ref 20.0–30.0)
Calcium: 9.3 mg/dL (ref 8.5–10.5)
Chloride: 101 mMol/L (ref 98–110)
Creatinine: 0.79 mg/dL (ref 0.60–1.20)
EGFR: 96 mL/min/{1.73_m2} (ref 60–150)
Globulin: 3.8 gm/dL (ref 2.0–4.0)
Glucose: 190 mg/dL — ABNORMAL HIGH (ref 70–99)
Osmolality Calc: 283 mOsm/kg (ref 275–300)
Potassium: 4.6 mMol/L (ref 3.5–5.3)
Protein, Total: 7.3 gm/dL (ref 6.0–8.3)
Sodium: 139 mMol/L (ref 136–147)

## 2016-11-15 LAB — CBC AND DIFFERENTIAL
Bands: 3 % (ref 0–10)
Basophils Absolute: 0 10*3/uL (ref 0.0–0.3)
Eosinophils %: 3 % (ref 0.0–7.0)
Eosinophils Absolute: 0.3 10*3/uL (ref 0.0–0.8)
Hematocrit: 45.9 % (ref 36.0–48.0)
Hemoglobin: 14.9 gm/dL (ref 12.0–16.0)
Lymphocytes Absolute: 1.5 10*3/uL (ref 0.6–5.1)
Lymphocytes: 18 % (ref 15.0–46.0)
MCH: 30 pg (ref 28–35)
MCHC: 32 gm/dL (ref 32–36)
MCV: 92 fL (ref 80–100)
MPV: 9.7 fL (ref 6.0–10.0)
Metamyelocytes: 2 % — ABNORMAL HIGH (ref 0–0)
Monocytes Absolute: 0.3 10*3/uL (ref 0.1–1.7)
Monocytes: 4 % (ref 3.0–15.0)
Neutrophils %: 70 % (ref 42.0–78.0)
Neutrophils Absolute: 6.3 10*3/uL (ref 1.7–8.6)
RBC: 4.99 10*6/uL (ref 3.80–5.00)
RDW: 12.4 % (ref 11.0–14.0)
WBC: 8.4 10*3/uL (ref 4.0–11.0)

## 2016-11-15 LAB — TSH: TSH: 1.53 u[IU]/mL (ref 0.40–4.20)

## 2016-11-16 ENCOUNTER — Other Ambulatory Visit: Payer: Self-pay | Admitting: Gastroenterology

## 2016-11-16 ENCOUNTER — Other Ambulatory Visit
Admission: RE | Admit: 2016-11-16 | Discharge: 2016-11-16 | Disposition: A | Discharge status: Home / Self Care | Payer: No Typology Code available for payment source | Source: Ambulatory Visit | Attending: Gastroenterology | Admitting: Gastroenterology

## 2016-11-16 ENCOUNTER — Ambulatory Visit (INDEPENDENT_AMBULATORY_CARE_PROVIDER_SITE_OTHER): Payer: No Typology Code available for payment source

## 2016-11-16 DIAGNOSIS — R1310 Dysphagia, unspecified: Secondary | ICD-10-CM

## 2016-11-20 LAB — CELIAC DISEASE COMP PANEL
Immunoglobulin A: 464 mg/dL — ABNORMAL HIGH (ref 81–463)
tTG Ab, IgA: 2 U/mL (ref <4)

## 2016-12-21 ENCOUNTER — Other Ambulatory Visit: Payer: 59 | Attending: Family Medicine

## 2016-12-21 ENCOUNTER — Ambulatory Visit (INDEPENDENT_AMBULATORY_CARE_PROVIDER_SITE_OTHER): Payer: 59 | Admitting: Family Medicine

## 2016-12-21 ENCOUNTER — Encounter (INDEPENDENT_AMBULATORY_CARE_PROVIDER_SITE_OTHER): Payer: Self-pay | Admitting: Family Medicine

## 2016-12-21 VITALS — BP 106/76 | HR 83 | Temp 98.7°F | Resp 16 | Ht 69.0 in | Wt 292.0 lb

## 2016-12-21 DIAGNOSIS — N898 Other specified noninflammatory disorders of vagina: Secondary | ICD-10-CM

## 2016-12-21 DIAGNOSIS — R102 Pelvic and perineal pain: Secondary | ICD-10-CM

## 2016-12-21 DIAGNOSIS — N949 Unspecified condition associated with female genital organs and menstrual cycle: Secondary | ICD-10-CM | POA: Insufficient documentation

## 2016-12-21 MED ORDER — HYDROCORTISONE 2.5 % TOPICAL CREAM
TOPICAL_CREAM | Freq: Two times a day (BID) | CUTANEOUS | 0 refills | Status: AC
Start: 2016-12-21 — End: 2016-12-28

## 2016-12-21 NOTE — Patient Instructions (Signed)
[facility]  [address] [place], [place] [address]  Phone: [phone])  Fax: [phone]               Open Mon - Sat 8:00am - 8:00pm Ena Dawley- 8:00pm                                                              ~ Closed Thanksgiving and Christmas Day     Attending Caregiver: Cathie Hoops, MD      Today's orders: No orders of the defined types were placed in this encounter.       Prescription(s) E-Rx to:  [id number] [place], [place]    ________________________________________________________________________  Short Term Disability and [facility] does NOT provide assistance with any disability applications.  If you feel your medical condition requires you to be on disability, you will need to follow up with  Your primary care physician or a specialist.  We apologize for any inconvenience.    For Medication Prescribed by [facility]:  As an Urgent Care facility, our clinic does NOT offer prescription refills over the telephone.    If you need more of the medication one of our medical providers prescribed, you will  Either need to be re-evaluated by [place] or see your primary care physician.    ________________________________________________________________________      It is very important that we have a phone number that is the single best way to contact you in the event that we become aware of important clinical information or concerns after your discharge.  If the phone number you provided at registration is NOT this number you should inform staff and registration prior to leaving.      Your treatment and evaluation today was focused on identifying and treating potentially emergent conditions based on your presenting signs, symptoms, and history.  The resulting initial clinical impression and treatment plan is not intended to be definitive or a substitute for a full physical examination and evaluation by your primary care provider.   If your symptoms persist, worsen, or you develop any new or concerning symptoms, you need to be evaluated.      If you received x-rays during your visit, be aware that the final and formal interpretation of those films by a radiologist may occur after your discharge.  If there is a significant discrepancy identified after your discharge, we will contact you at the telephone number provided at registration.      If you received a pelvic exam, you may have cultures pending for sexually transmitted diseases.  Positive cultures are reported to the [place] Department of Health as required by state law.  You should be contacted if you cultures are positive.  We will not contact you if they are negative.  You did NOT receive a PAP smear (the screening test for cervical).  This specific test for women is best performed by your gynecologist or primary care provider when indicated.      If you are over 72 year old, we cannot discuss your personal health information with a parent, spouse, family member, or anyone else without your express consent.  This does not include those who have legitimate access  to your records and information to assist in your care under the provisions of HIPAA [facility](Health Insurance Portability and Accountability Act) law, or those to whom you have previously given express written consent to do so, such a legal guardian or Power of [place].      You may have received medication that may cause you to feel drowsy and/or light headed for several hours.  You may even experience some amnesia of your stay.  You should avoid operating a motor vehicle or performing any activity requiring complete alertness or coordination until you feel fully awake (approximately 24-48 hours).  Avoid alcoholic beverages.  You may also have a dry mouth for several hours.  This is a normal side effect and will disappear as the effects of the medication wear off.      Instructions discussed with patient upon discharge by clinical staff  with all questions answered.  Please call [facility] [phone]) if any further questions.  Go immediately to the emergency department if any concern or worsening symptoms.      Roxanne GatesEsther K Zaim Nitta, MD 12/21/2016, 18:13

## 2016-12-21 NOTE — Progress Notes (Signed)
Angelica Garcia  Date of Service: 12/21/2016    Chief complaint:   Chief Complaint   Patient presents with   . Vaginal Pain     Subjective  36 year old female presenting with perineal pain x2 days.  Reports it is worse when she sits down.  She denies any trauma.  Denies any dysuria.  Reports some possible vaginal discharge. Denies any risk for sexually transmitted diseases.  She denies any intercourse for the past 2 weeks.  She is married to 1 female partner.  He has no symptoms.  She reports her spouse looked at it and said he saw some swelling in the area.   Denies fever, chills, sweats.  Denies diarrhea or constipation.    Past Medical History  Current Outpatient Prescriptions   Medication Sig   . hydrocortisone 2.5 % Cream Apply topically Twice daily for 7 days   . meloxicam (MOBIC) 15 mg Oral Tablet TAKE 1 TABLET EVERY DAY FOR MODERATE TO SEVERE PAIN   . metaxalone (SKELAXIN) 800 mg Oral Tablet TAKE 1 TABLET AT BEDTIME AS NEEDED FOR SPASMS     No Known Allergies  Past Medical History:   Diagnosis Date   . IBS (irritable bowel syndrome)          Past Surgical History:   Procedure Laterality Date   . HX CHOLECYSTECTOMY     . HX UPPER ENDOSCOPY           Family Medical History     None            Social History     Social History   . Marital status: Married     Spouse name: N/A   . Number of children: N/A   . Years of education: N/A     Social History Main Topics   . Smoking status: Never Smoker   . Smokeless tobacco: Never Used   . Alcohol use Yes   . Drug use: No   . Sexual activity: Yes     Partners: Male     Birth control/ protection: IUD     Other Topics Concern   . Not on file     Social History Narrative   . No narrative on file     ROS: Per HPI above and no HA/vision changes/chest pain/nausea/vomiting/Diarrhea/rash/weakness      Objective  Vitals: BP 106/76  Pulse 83  Temp 37.1 C (98.7 F) (Oral)   Resp 16  Ht 1.753 m (5\' 9" )  Wt 132.5 kg (292 lb)  SpO2 99%  BMI 43.12 kg/m2  General: no distress,  morbidly obese.   Eyes: Conjunctiva clear. PERRL  HEENT: mucous membranes moist  Neck: no adenopathy  Lungs: clear to auscultation bilaterally.   Cardiovascular: Heart regular rate and rhythm  Skin: Skin warm and dry, no rashes  Extremities: no cyanosis or edema  Psych: Normal affect  GU:  Exam done with [facility] her LPN present.  No notable swelling at the area of concern.  There is a small fissure and white copious vaginal discharge.            Assessment/Plan  1. Perineal pain in female    2. Vaginal discharge      -prescription as below for her discomfort.  Will send genital culture and notify her if any further treatment is indicated.  Return or follow up with PCP if symptoms persist and to the ER if worse.    Orders Placed This Encounter   .  GENITAL CULTURE   . hydrocortisone 2.5 % Cream         Roxanne GatesEsther K Hazell Siwik, MD  12/21/2016, 18:57    Portions of this note may be dictated using voice recognition software or a dictation service. Variances in spelling and vocabulary are possible and unintentional. Not all errors are caught/corrected. Please notify the [facility] if any discrepancies are noted or if the meaning of any statement is not clear.

## 2016-12-21 NOTE — Nursing Note (Signed)
BP 106/76  Pulse 83  Temp 37.1 C (98.7 F) (Oral)   Resp 16  Ht 1.753 m (5\' 9" )  Wt 132.5 kg (292 lb)  SpO2 99%  BMI 43.12 kg/m2  United Technologies CorporationHeather Makinze Jani, CMA  12/21/2016, 18:08

## 2016-12-24 NOTE — Progress Notes (Signed)
Urine culture is preliminary,  Continue with current plan of treatment.  Will await official reading.

## 2016-12-25 NOTE — Progress Notes (Signed)
Please call and let patient know results for culture were negative.

## 2017-01-18 ENCOUNTER — Ambulatory Visit (INDEPENDENT_AMBULATORY_CARE_PROVIDER_SITE_OTHER): Payer: 59 | Admitting: Family

## 2017-01-18 ENCOUNTER — Ambulatory Visit (INDEPENDENT_AMBULATORY_CARE_PROVIDER_SITE_OTHER): Payer: 59 | Attending: Family

## 2017-01-18 ENCOUNTER — Encounter (INDEPENDENT_AMBULATORY_CARE_PROVIDER_SITE_OTHER): Payer: Self-pay | Admitting: Family

## 2017-01-18 VITALS — BP 108/78 | HR 84 | Temp 97.9°F | Resp 16 | Ht 69.0 in | Wt 296.6 lb

## 2017-01-18 DIAGNOSIS — Z975 Presence of (intrauterine) contraceptive device: Secondary | ICD-10-CM | POA: Insufficient documentation

## 2017-01-18 DIAGNOSIS — Z0001 Encounter for general adult medical examination with abnormal findings: Secondary | ICD-10-CM

## 2017-01-18 DIAGNOSIS — Z0000 Encounter for general adult medical examination without abnormal findings: Secondary | ICD-10-CM

## 2017-01-18 DIAGNOSIS — N898 Other specified noninflammatory disorders of vagina: Secondary | ICD-10-CM

## 2017-01-18 DIAGNOSIS — Z23 Encounter for immunization: Secondary | ICD-10-CM

## 2017-01-18 LAB — COMPREHENSIVE METABOLIC PROFILE - BMC/JMC ONLY
ALBUMIN/GLOBULIN RATIO: 1.1 (ref 0.8–2.0)
ALBUMIN: 3.5 g/dL (ref 3.5–5.0)
ALKALINE PHOSPHATASE: 55 U/L (ref 38–126)
ALT (SGPT): 20 U/L (ref 14–54)
ANION GAP: 7 mmol/L (ref 3–11)
AST (SGOT): 19 U/L (ref 15–41)
BILIRUBIN TOTAL: 0.9 mg/dL (ref 0.3–1.2)
BUN/CREA RATIO: 17 (ref 6–22)
BUN: 12 mg/dL (ref 6–20)
CALCIUM: 9.2 mg/dL (ref 8.6–10.3)
CHLORIDE: 103 mmol/L (ref 101–111)
CO2 TOTAL: 28 mmol/L (ref 22–32)
CREATININE: 0.69 mg/dL (ref 0.44–1.00)
ESTIMATED GFR: >60 mL/min/1.73mˆ2 (ref >60)
GLUCOSE: 121 mg/dL — ABNORMAL HIGH (ref 70–110)
GLUCOSE: 121 mg/dL — ABNORMAL HIGH (ref 70–110)
POTASSIUM: 4.6 mmol/L (ref 3.4–5.1)
POTASSIUM: 4.6 mmol/L (ref 3.4–5.1)
PROTEIN TOTAL: 6.6 g/dL (ref 6.4–8.3)
SODIUM: 138 mmol/L (ref 136–145)

## 2017-01-18 LAB — CBC W/AUTO DIFF
BASOPHIL #: 0 x10ˆ3/uL (ref 0.00–0.10)
BASOPHIL %: 1 % (ref 0–3)
EOSINOPHIL %: 2 % (ref 0–5)
HGB: 13.7 g/dL (ref 12.0–15.5)
LYMPHOCYTE #: 1.3 x10ˆ3/uL (ref 1.00–4.80)
LYMPHOCYTE %: 20 % (ref 15–43)
LYMPHOCYTE %: 20 % (ref 15–43)
MCH: 28.8 pg (ref 27.5–33.2)
MCHC: 32.9 g/dL (ref 32.0–36.0)
MCV: 87.5 fL (ref 82.0–97.0)
MONOCYTE #: 0.4 x10ˆ3/uL (ref 0.20–0.90)
NEUTROPHIL #: 4.7 10*3/uL (ref 1.50–6.50)
NEUTROPHIL %: 71 % (ref 43–76)
PLATELETS: 287 10*3/uL (ref 150–450)
PLATELETS: 287 x10ˆ3/uL (ref 150–450)

## 2017-01-18 LAB — LIPID PANEL
CHOL/HDL RATIO: 4.3
CHOLESTEROL: 149 mg/dL (ref 120–199)
HDL CHOL: 35 mg/dL — ABNORMAL LOW (ref 45–65)
HDL CHOL: 35 mg/dL — ABNORMAL LOW (ref 45–65)
LDL CALC: 99 mg/dL (ref <=130)
TRIGLYCERIDES: 75 mg/dL (ref 35–135)
VLDL CALC: 15 mg/dL (ref 5–35)

## 2017-01-18 LAB — THYROID STIMULATING HORMONE WITH FREE T4 REFLEX: TSH: 1.486 u[IU]/mL (ref 0.340–5.330)

## 2017-01-18 NOTE — Progress Notes (Signed)
[address], [address]  [place] [address]  Dept: [phone]  Dept Fax: [phone]  Loc: [phone]  [place] Fax: [phone]    Patient name: Angelica Garcia  Age: 36 y.o. 11/18/1981  DOS: 01/18/17    Chief Complaint: Angelica Garcia is a 36 y.o. female who presents for   Chief Complaint   Patient presents with   . Establish Care   . Physical      HPI/Subjective:  Patient is here for Establish Care and Physical.   Moved to the area recently to work for [employer].  No acute concern today.  Patient reports she also like to establish with the Gynecology, she has IUD placed in February 2014 and it needs to be replaced in February 2019.  Patient also reported abnormal vaginal discharge/urine incontinence/feces incontinence/anal fistula.  Patient said she was told 1 time that she has a fissure and she think she is leaking from it.   Will refer patient to the gynecology for further evaluation at this time to rule out urine incontinence or any fissure.  After gynecology workup will consider to refer patient to Colorectal surgery as needed.    Health Maintenance: Pending and Last Completed       Date Due Completion Date    Depression Screening 11/11/1991 Neg today    HIV Screening 11/10/1994 Low risk    Adult Tdap-Td (1 - Tdap) 11/10/1998 Refused today    Pap smear 11/10/2000 Will refer to gyn    Influenza Vaccine (1) 07/27/2016 Today        PHQ Questionnaire  Little interest or pleasure in doing things.: Not at all  Feeling down, depressed, or hopeless: More than half the days  PHQ 2 Total: 2      Review of Systems:   Constitutional: no recent weight loss, no fever, no chills.  HEENT: Negative  Cardiovascular: negative for chest pain, chest pressure/discomfort, palpitations, near-syncope and syncope  Respiratory: negative for cough, wheezing or SOB.   GI: negative for abdominal pain, nausea, vomiting, constipation, diarrhea and bloody stools  Musculoskeletal: ++joint  pain-LBP-stable now, follows Ortho and Physical therapy, takes medicine only as needed. No joint swelling, joint stiffness and muscle pain  GU: negative for frequency, dysuria, or hematuria. ++ urinary incontinence. ++ vaginal discharge   Neurological: No vertigo. No Hx of seizures. No memory problems, speech problems, or gait problems. No tremor  Emotional/Pscychiatric: negative for anxiety, depression and insomnia  Skin: negative for any mole changes. No rash  Endocrine: No diabetes. No thyroid problems  Hematologic/Lymphatic: there is no easy bleeding or bruising         Care Team     PCP     Name Type Specialty Phone Number    None, Given Not available Not available Not available          Care Team     No care team found                Allergy, Soc Hx, PMH, PSH, and Fam Hx reviewed and as below.   No Known Allergies  Social History     Social History   . Marital status: Married     Spouse name: N/A   . Number of children: 3   . Years of education: N/A     Social History Main Topics   . Smoking status: Never Smoker   . Smokeless tobacco: Never Used   . Alcohol use Yes  Comment: occasionally    . Drug use: No   . Sexual activity: Yes     Partners: Male     Birth control/ protection: IUD      Comment: IUD inserted in 12/2012     Other Topics Concern   . Not on file     Social History Narrative   . No narrative on file     Past Medical History:   Diagnosis Date   . Anxiety    . Depression    . IBS (irritable bowel syndrome)    . Low back pain     followed by ortho in [place] and [facility]         Past Surgical History:   Procedure Laterality Date   . D&C FIRST TRIMESTER / TX INCOMPLETE / MISSED / SEPTIC / INDUCED ABORTION     . HX CHOLECYSTECTOMY     . HX ELECTIVE ABORTION     . HX UPPER ENDOSCOPY     . HX WISDOM TEETH EXTRACTION           Family Medical History     Problem Relation (Age of Onset)    Celiac Disease Mother    Diabetes Mother, Father               Physical Exam:  VS (completed by my clinic  staff): BP 108/78  Pulse 84  Temp 36.6 C (97.9 F) (Oral)   Resp 16  Ht 1.753 m (5' 9")  Wt 134.5 kg (296 lb 9.6 oz)  BMI 43.8 kg/m2   Physical Exam:   General: Patient is 36 y.o., female, well dressed, in no acute distress. VS stable. ++ morbid obese.  HEENT:    Head: Normocephalic.    Eyes: No discharge, redness. PERRL and EOMI intact.    Ears: bilateral TM's and external ear canals normal   Nose: Nares normal. Septum midline. Mucosa normal. Turbinates normal. No sinus tenderness.   Oropharynx: MMM no lesions. Pharynx without injection or exudate  Neck: supple, no lymphadenopathy, thyroid normal.   Heart: RRR. No murmur.  Lungs: clear to auscultation. Unlabored breathing  Abdomen:   Obese, non-tender, and non-distended. No masses.   Musculoskeletal: Gait normal. no joint tenderness, deformity or swelling. DTR normal.   Extremities: no edema  Mental Status: alert, oriented to person, place, and time. Normal mood, and behavior.   Skin: Normal color, texture and turgor.  Neuro: Gait normal. Sensation grossly normal. No focal deficits noted. Strength is normal and equal to UE.     Assessment and Plan:  Santiaga was seen today for establish care and physical.    Diagnoses and all orders for this visit:    Encounter for general adult medical examination with abnormal findings   Order screening labs as below, will follow up with the results.    Educated on healthy lifestyle, healthy diet and regular exercise.   Continue to avoid alcohol and smoking.    Encouraged yearly physical.  Refer to the gynecology for screening Pap and other gynecology problems.  -     CBC W/AUTO DIFF - [facility] ONLY; Future  -     COMPREHENSIVE METABOLIC PROFILE - [facility] ONLY; Future  -     THYROID STIMULATING HORMONE WITH FREE T4 REFLEX; Future  -     LIPID PANEL; Future  -     Refer to [facility]; Future    Vaginal discharge/IUD (intrauterine device) in place  Refer to the gynecology for further  evaluation and treatment.   Follow-up with me after gynecology consultation.  Will consider to refer to colorectal surgery as needed.  -     Refer to [facility]; Future    Need for influenza vaccination  -     Influenza Vaccine IM - Age 59 through Adult (Admin)    Morbid obese.   BMI addressed: Advised on diet, weight loss, and exercise to reduce above normal BMI.    Follow up: Return in about 1 year (around 01/18/2018) for Annual physical and as needed..      No outpatient prescriptions have been marked as taking for the 01/18/17 encounter (Office Visit) with Loreen Freud, APRN.       LABS:     Comprehensive Metabolic Profile    No results found for: SODIUM, POTASSIUM, CHLORIDE, CO2, ANIONGAP, BUN, CREATININE, GLUCOSE No results found for: CALCIUM, PHOSPHORUS, ALBUMIN, TOTALPROTEIN, ALKPHOS, AST, ALT, TOTBILIRUBIN                  CBC  Diff   No results found for: WBC, WBCJ, HGB, HCT, PLTCNT, SEDRATE, ESR, RBC, MCV, MCHC, MCH, RDW, MPV No results found for: PMNS, LYMPHOCYTES, EOSINOPHIL, MONOCYTES, BASOPHILS, PMNABS, LYMPHSABS, EOSABS, MONOSABS, BASOSABS, BASABS         No results found for: TSH        No results found for: CHOLESTEROL, HDLCHOL, LDLCHOL, LDLCHOLDIR, TRIG     Collaborative/Supervising Physician: Dr. Johna Sheriff, APRN    Portions of this note may be dictated using voice recognition software . Variances in spelling and vocabulary are possible and unintentional. Not all errors are caught/corrected. Please notify the [facility] if any discrepancies are noted or if the meaning of any statement is not clear.

## 2017-01-21 NOTE — Progress Notes (Signed)
Left message requesting call back for lab results.   Floreen ComberLauren Karo Rog, LPN  1/19/14782/26/2018, 13:35

## 2017-01-23 NOTE — Progress Notes (Signed)
Patient advised of lab results and verbalized understanding.   Katrinka BlazingJennifer Erinne Gillentine, LPN  1/61/09602/28/2018, 10:43

## 2017-02-11 ENCOUNTER — Encounter (INDEPENDENT_AMBULATORY_CARE_PROVIDER_SITE_OTHER): Payer: Self-pay | Admitting: OBSTETRICS/GYNECOLOGY

## 2017-02-11 ENCOUNTER — Ambulatory Visit (INDEPENDENT_AMBULATORY_CARE_PROVIDER_SITE_OTHER): Payer: 59 | Admitting: OBSTETRICS/GYNECOLOGY

## 2017-02-11 ENCOUNTER — Other Ambulatory Visit (HOSPITAL_COMMUNITY): Payer: Self-pay | Admitting: OBSTETRICS/GYNECOLOGY

## 2017-02-11 VITALS — BP 118/68 | HR 76 | Ht 69.0 in | Wt 295.0 lb

## 2017-02-11 DIAGNOSIS — Z6841 Body Mass Index (BMI) 40.0 and over, adult: Secondary | ICD-10-CM

## 2017-02-11 DIAGNOSIS — Z113 Encounter for screening for infections with a predominantly sexual mode of transmission: Secondary | ICD-10-CM | POA: Insufficient documentation

## 2017-02-11 DIAGNOSIS — B9689 Other specified bacterial agents as the cause of diseases classified elsewhere: Secondary | ICD-10-CM

## 2017-02-11 DIAGNOSIS — N760 Acute vaginitis: Secondary | ICD-10-CM

## 2017-02-11 DIAGNOSIS — Z01411 Encounter for gynecological examination (general) (routine) with abnormal findings: Secondary | ICD-10-CM

## 2017-02-11 DIAGNOSIS — Z1151 Encounter for screening for human papillomavirus (HPV): Secondary | ICD-10-CM | POA: Insufficient documentation

## 2017-02-11 DIAGNOSIS — N898 Other specified noninflammatory disorders of vagina: Secondary | ICD-10-CM | POA: Insufficient documentation

## 2017-02-11 DIAGNOSIS — Z975 Presence of (intrauterine) contraceptive device: Secondary | ICD-10-CM

## 2017-02-11 DIAGNOSIS — Z0001 Encounter for general adult medical examination with abnormal findings: Secondary | ICD-10-CM

## 2017-02-11 DIAGNOSIS — Z124 Encounter for screening for malignant neoplasm of cervix: Secondary | ICD-10-CM

## 2017-02-11 DIAGNOSIS — Z01419 Encounter for gynecological examination (general) (routine) without abnormal findings: Secondary | ICD-10-CM

## 2017-02-11 HISTORY — DX: Morbid (severe) obesity due to excess calories: E66.01

## 2017-02-11 HISTORY — DX: Body Mass Index (BMI) 40.0 and over, adult: Z684

## 2017-02-11 MED ORDER — METRONIDAZOLE 500 MG TABLET: 500 mg | Tab | Freq: Two times a day (BID) | ORAL | 0 refills | 0 days | Status: AC

## 2017-02-11 NOTE — H&P (Signed)
[facility]  [address]  [place], [place]  [address]  [phone]      Name of Patient: Angelica Garcia  DOB: 02-Aug-1981  MRN: [id number]    Date of Service: 02/11/2017        HPI Comments:    36 y.o. [id number]  Chief Complaint   Patient presents with   . Vaginal Odor     x one year    . Vaginal Discharge     X one year        Patient presents for annual exam.   Patient denies and pelvic pain.   She does reports malodorous tan/yellow vaginal discharge present for the last ONE YEAR. She will notice a wet spot after sitting on a chair/couch and leaving an odor where she sits.     Amenorrhea due to Mirena    Sexually active: yes  New Partner: no - current for 18 years   Contraception: Mirena   Lifetime Sex Partners:  < 10   H/O STD: No  Last Pap: 2 years ago, WNL   H/O abnormal Pap:  No    Mirena in place since 2009 - Last one placed 4 years ago, expires next year.       REVIEW OF SYSTEMS :  Constitutional: Negative. Denies any fever, chills or fatigue   Skin: Negative.  Denies any rash or skin changes  HENT: Negative.  Denies any headaches, visual changes, swallowing issues  Cardiovascular: Negative.  Denies any chest pain or palpitations  Respiratory: Negative.  Denies any difficulty breathing or cough  Gastrointestinal: Negative.  Denies any change in bowel habits, nausea or vomiting  Genitourinary:        See HPI   Musculoskeletal: Negative.   Neurological: Negative.    Psychiatric: Negative.      Patient's PMH/PSH/FH/SH/Meds/Allergies were all reviewed and noted in the EPIC chart on today's visit.    Past Medical History:   Diagnosis Date   . Anxiety    . Depression    . IBS (irritable bowel syndrome)    . Low back pain     [place] by [place] in [place] and [facility]         Past Surgical History:   Procedure Laterality Date   . D&C FIRST TRIMESTER / TX INCOMPLETE / MISSED / SEPTIC / INDUCED ABORTION     . HX CHOLECYSTECTOMY     . HX ELECTIVE ABORTION     . HX UPPER ENDOSCOPY      . HX WISDOM TEETH EXTRACTION           Family Medical History     Problem Relation (Age of Onset)    Celiac Disease Mother    Diabetes Mother, Father            Current Outpatient Prescriptions   Medication Sig Dispense Refill   . meloxicam (MOBIC) 15 mg Oral Tablet TAKE 1 TABLET EVERY DAY FOR MODERATE TO SEVERE PAIN  0   . metaxalone (SKELAXIN) 800 mg Oral Tablet TAKE 1 TABLET AT BEDTIME AS NEEDED FOR SPASMS  0   . metroNIDAZOLE (FLAGYL) 500 mg Oral Tablet Take 1 Tab (500 mg total) by mouth Twice daily for 10 days 20 Tab 0     No current facility-administered medications for this visit.      No Known Allergies  Social History     Social History   . Marital status: Married     Spouse name: N/A   .  Number of children: 3   . Years of education: N/A     Occupational History   . Not on file.     Social History Main Topics   . Smoking status: Never Smoker   . Smokeless tobacco: Never Used   . Alcohol use Yes      Comment: occasionally    . Drug use: No   . Sexual activity: Yes     Partners: Male     Birth control/ protection: IUD      Comment: IUD inserted in 12/2012     Other Topics Concern   . Not on file     Social History Narrative   . No narrative on file       Objective:  BP 118/68  Pulse 76  Ht 1.753 m (5\' 9" )  Wt 133.8 kg (295 lb)  BMI 43.56 kg/m2    PHYSICAL EXAMINATION:    Constitutional: She is alert and oriented. She appears well-developed and well-nourished.     HENT: Head: Normocephalic. PERRLA.     Cardiovascular: Normal rate and regular rhythm.  S1-S2 and no murmurs    Pulm: Effort normal, breath sounds normal bilaterally    Abdomen: She exhibits no distension. Soft. No tenderness. She has no rebound and no guarding. Morbid obesity, large panus.     Neurological: She is alert and oriented.  She has normal reflexes. CNII-XII are grossly intact.    Skin: Skin is warm and dry. No rashes or bruising.     Psychiatric: She has a normal mood and affect. Her behavior is normal.     Extremities: No lower  extremity edema or calf tenderness bilaterally    BREAST EXAM:  Symmetric breasts.   Right normal breast; with no palpable masses, skin changes or nipple discharge. No tenderness to right breast.   Left normal breast; with no palpable masses, skin changes or nipple discharge.  Mild tenderness under left breast     GU:  External Exam: External exam normal female genitalia.  No urethral tenderness.  No genital lesions present.    Vaginal Exam:  Vagina normal to exam. Moist and pink vaginal mucosa.  There is copious tan/brown vaginal discharge, pooling and running out of vagina during exam. Wet prep collected.   Bladder:  Bladder is normal to exam. Suprapubic fullness not present.    Uterus: Uterus is difficult to assess. Normal size.  Cervix is normal to exam. There is thick/tan cervical discharge but no cervical motion tenderness.   Position: Difficult to assess    Contour:  Regular.    Mobility:  Mobile.    Adnexa:  No palpable masses and no tenderness bilaterally.    Rectovaginal exam:   Not done       WET PREP RESULTS:    Clue Cells POSITIVE  WBC  PRESENT  WHIFF  POSITIVE  TRICH  NEG  YEAST NEG    PH  5.0      Assessment & Plan:     ICD-10-CM    1. Well female exam with routine gynecological exam Z01.419    2. Encounter for general adult medical examination with abnormal findings Z00.01 Refer to [facility] [place]   3. IUD (intrauterine device) in place Z97.5 Refer to [facility] [place]   4. Morbid obesity with BMI of 40.0-44.9, adult (HCC) E66.01     Z68.41    5. Vaginal odor N89.8 KOH/WET PREP GYN PROCEDURE(AMB)   6. Vaginal discharge N89.8 KOH/WET PREP GYN PROCEDURE(AMB)  7. BV (bacterial vaginosis) N76.0 metroNIDAZOLE (FLAGYL) 500 mg Oral Tablet    B96.89        Routine health maintenance  Pap and HPV co-testing collected  Recommend STD screening due to physical exam findings  Reviewed wet prep - recommend treating BV. R/B and SE of Flagyl reviewed.   Recommend re-check in a few weeks. If symptoms  resolved, no further evaluation.   Patient may need pelvic US    Will call with any abnormal results  Follow-up in one month    The patient verbalizes a reasonable understanding of her diagnosis and is in agreement with the plan of care.  All risks, benefits and alternatives were reviewed with the patient.  All questions were answered.       Orders Placed This Encounter   . KOH/WET PREP GYN PROCEDURE(AMB)   . metroNIDAZOLE (FLAGYL) 500 mg Oral Tablet

## 2017-02-11 NOTE — Procedures (Signed)
WET PREP RESULTS:    Clue Cells POSITIVE  WBC  PRESENT  WHIFF  POSITIVE  TRICH  NEG  YEAST NEG    PH  5.0

## 2017-02-12 ENCOUNTER — Other Ambulatory Visit (HOSPITAL_BASED_OUTPATIENT_CLINIC_OR_DEPARTMENT_OTHER): Payer: 59 | Attending: OBSTETRICS/GYNECOLOGY

## 2017-02-12 DIAGNOSIS — Z124 Encounter for screening for malignant neoplasm of cervix: Secondary | ICD-10-CM

## 2017-02-12 DIAGNOSIS — N898 Other specified noninflammatory disorders of vagina: Secondary | ICD-10-CM

## 2017-02-13 LAB — CHLAMYDIA TRACHOMATIS/NEISSERIA GONORRHOEAE RNA, NAAT
CHLAMYDIA TRACHOMATIS RNA: NEGATIVE
NEISSERIA GONORRHEA GC RNA: NEGATIVE

## 2017-02-14 LAB — HPV RNA, HIGH RISK - BMC/JMC ONLY: HPV RNA PCR: NEGATIVE

## 2017-02-18 LAB — HISTORICAL CYTOPATHOLOGY-GYN (PAP AND HPV TESTS)

## 2017-02-20 ENCOUNTER — Encounter (INDEPENDENT_AMBULATORY_CARE_PROVIDER_SITE_OTHER): Payer: Self-pay | Admitting: OBSTETRICS/GYNECOLOGY

## 2017-03-15 ENCOUNTER — Ambulatory Visit (INDEPENDENT_AMBULATORY_CARE_PROVIDER_SITE_OTHER): Payer: 59 | Admitting: OBSTETRICS/GYNECOLOGY

## 2017-03-15 ENCOUNTER — Encounter (INDEPENDENT_AMBULATORY_CARE_PROVIDER_SITE_OTHER): Payer: Self-pay | Admitting: OBSTETRICS/GYNECOLOGY

## 2017-03-15 VITALS — BP 122/72 | HR 70 | Temp 98.3°F | Ht 69.0 in | Wt 291.0 lb

## 2017-03-15 DIAGNOSIS — N730 Acute parametritis and pelvic cellulitis: Principal | ICD-10-CM

## 2017-03-15 MED ORDER — DOXYCYCLINE HYCLATE 100 MG TABLET: 100 mg | Tab | Freq: Two times a day (BID) | ORAL | 0 refills | 0 days | Status: AC

## 2017-03-15 MED ORDER — CEFTRIAXONE 500 MG SOLUTION FOR INJECTION
250.0000 mg | Freq: Every day | INTRAMUSCULAR | 0 refills | Status: DC
Start: 2017-03-15 — End: 2017-04-11

## 2017-03-15 NOTE — Nursing Note (Signed)
03/15/17 1300   Medication Administration   Initials ARR   Other Medication ROCEPHIN    Medication Dose 250 MG   Route of Administration IM   Site Left Gluteus   NDC # [id number]   LOT # [id number]   Expiration date 07/28/19   Manufacturer LUPIN   Clinic Supplied Yes   Patient Supplied No   Comments: PATIENT TOLERATED WELL AND WILL RETURN IFOR HER FOLLOW UP VISITS

## 2017-03-15 NOTE — Progress Notes (Signed)
[facility]  [address]  [place], [place]  [address]  [phone]        Name of Patient: Angelica Garcia  DOB: [birth year]  MRN: [id number]    Date of Service: 03/15/2017        Subjective:    36 y.o. [id number]     Chief Complaint   Patient presents with   . Follow-up After Testing     f/u medication    . Vaginal Discharge     yellow and tan/brownish color     Patient is here for follow-up.  Initially she was seen on 02/11/2017 for an annual exam.  Patient had copious vaginal discharge that she also complained of at that time.  She was diagnosed with severe vaginitis there was a low suspicion of PID at the time because of how copious the vaginal discharge was it was questionable whether this discharge was coming from the cervix on at that time.  Patient had no pelvic pain. Today she reports intermittent cramping and continued vaginal discharge even after a week's course of Flagyl.  Patient denies any fevers or chills.  She also had cervical cultures at that time which were negative.  She has a Mirena IUD in place.      REVIEW OF SYSTEMS :    Constitutional: Negative. Denies any fever, chills or fatigue   Gastrointestinal: Negative.  Denies any change in bowel habits, nausea or vomiting  Genitourinary:        See HPI       Patient's PMH/PSH/FH/SH/Meds/Allergies were all reviewed and noted in the EPIC chart on today's visit.    Past Medical History:   Diagnosis Date   . Anxiety    . Depression    . IBS (irritable bowel syndrome)    . Low back pain     followed by ortho in [place] and [place]         Past Surgical History:   Procedure Laterality Date   . D&C FIRST TRIMESTER / TX INCOMPLETE / MISSED / SEPTIC / INDUCED ABORTION     . HX CHOLECYSTECTOMY     . HX ELECTIVE ABORTION     . HX UPPER ENDOSCOPY     . HX WISDOM TEETH EXTRACTION           Family Medical History     Problem Relation (Age of Onset)    Celiac Disease Mother    Diabetes Mother, Father            Current  Outpatient Prescriptions   Medication Sig Dispense Refill   . cefTRIAXone (ROCEPHIN) Injection IM injection 250 mg by Intramuscular route Once a day 1 Each 0   . doxycycline 100 mg Oral Tablet Take 1 Tab (100 mg total) by mouth Twice daily for 14 days 28 Tab 0   . meloxicam (MOBIC) 15 mg Oral Tablet TAKE 1 TABLET EVERY DAY FOR MODERATE TO SEVERE PAIN  0   . metaxalone (SKELAXIN) 800 mg Oral Tablet TAKE 1 TABLET AT BEDTIME AS NEEDED FOR SPASMS  0     No current facility-administered medications for this visit.      No Known Allergies  Social History     Social History   . Marital status: Married     Spouse name: N/A   . Number of children: 3   . Years of education: N/A     Occupational History   . Not on file.  Social History Main Topics   . Smoking status: Never Smoker   . Smokeless tobacco: Never Used   . Alcohol use Yes      Comment: occasionally    . Drug use: No   . Sexual activity: Yes     Partners: Male     Birth control/ protection: IUD      Comment: IUD inserted in 12/2012     Other Topics Concern   . Not on file     Social History Narrative           Objective:  BP 122/72  Pulse 70  Temp 36.8 C (98.3 F) (Tympanic)   Ht 1.753 m ( )  Wt 132 kg (291 lb)  Breastfeeding? Unknown  BMI 42.97 kg/m2  Physical Exam:    Constitutional: She is oriented. She appears well-developed and well-nourished.     HENT: Head: Normocephalic.     Cardiovascular:  Normal rate, rhythm. S1-S2. No murmurs appreciated.    Pulmonary: Normal effort, no use of accessory muscles, lungs sounds clear bilaterally.      Abdomen: She exhibits no distension. Soft. No tenderness. She has no rebound and no guarding.     Psychiatric: She has a normal mood and affect. Her behavior is normal.     Extremities:  No edema, no calf tenderness bilaterally    Skin:  Normal, no rashes or skin lesions.    GU:  External Exam: External exam normal.    No urethral tenderness.  No genital lesions present.    Vaginal Exam:  Vagina normal to exam.     Bladder:  Bladder is normal to exam. Suprapubic fullness not present.    Uterus: Uterus is tender to palpation..  Cervix is yellow-green discharge from the external os.  IUD strings also visualized.  There is cervical motion tenderness today.  Position: Mid    Contour:  Regular.    Mobility:  Mobile.    Adnexa:  No palpable masses but this is difficult to assess due to body habitus but there is bilateral adnexal tenderness present.          Rectovaginal exam:  Not done       Assessment & Plan:     ICD-10-CM    1. PID (acute pelvic inflammatory disease) N73.0 doxycycline 100 mg Oral Tablet     cefTRIAXone (ROCEPHIN) Injection IM injection     Discussed suspicion for pelvic inflammatory disease.  At this time there is no indication to remove the IUD.  We will treat this.  If she does not improve we may need to remove her IUD.  Infection precautions reviewed.  Discussed likely resolution of her inflammation after a course of antibiotics we also reviewed the risk of progression to an abscess or worsening infection including sepsis.  She is to follow up in 3 weeks for a follow-up exam.    Orders Placed This Encounter   . doxycycline 100 mg Oral Tablet   . cefTRIAXone (ROCEPHIN) Injection IM injection

## 2017-04-11 ENCOUNTER — Encounter (INDEPENDENT_AMBULATORY_CARE_PROVIDER_SITE_OTHER): Payer: Self-pay | Admitting: OBSTETRICS/GYNECOLOGY

## 2017-04-11 ENCOUNTER — Ambulatory Visit (INDEPENDENT_AMBULATORY_CARE_PROVIDER_SITE_OTHER): Payer: 59 | Admitting: OBSTETRICS/GYNECOLOGY

## 2017-04-11 VITALS — BP 100/62 | HR 76 | Ht 69.0 in | Wt 291.0 lb

## 2017-04-11 DIAGNOSIS — Z975 Presence of (intrauterine) contraceptive device: Secondary | ICD-10-CM

## 2017-04-11 DIAGNOSIS — R102 Pelvic and perineal pain: Secondary | ICD-10-CM

## 2017-04-11 DIAGNOSIS — N898 Other specified noninflammatory disorders of vagina: Secondary | ICD-10-CM

## 2017-04-11 DIAGNOSIS — Z09 Encounter for follow-up examination after completed treatment for conditions other than malignant neoplasm: Secondary | ICD-10-CM

## 2017-04-11 DIAGNOSIS — B9689 Other specified bacterial agents as the cause of diseases classified elsewhere: Secondary | ICD-10-CM

## 2017-04-11 DIAGNOSIS — N760 Acute vaginitis: Secondary | ICD-10-CM

## 2017-04-11 DIAGNOSIS — Z6841 Body Mass Index (BMI) 40.0 and over, adult: Secondary | ICD-10-CM

## 2017-04-11 MED ORDER — METRONIDAZOLE 500 MG TABLET: 500 mg | Tab | Freq: Two times a day (BID) | ORAL | 0 refills | 0 days | Status: AC

## 2017-04-11 NOTE — Procedures (Signed)
WET PREP RESULTS:    Clue Cells POSITIVE  WBC  PRESENT  WHIFF  POSITIVE  TRICH  NEG  YEAST NEG    PH  5.0

## 2017-04-11 NOTE — Progress Notes (Signed)
[facility]  [address]  [place], [place]  [id number]  [phone]        Name of Patient: Angelica Garcia  DOB: [birth year]  MRN: [id number]    Date of Service: 04/11/2017        Subjective:    36 y.o. [id number]     Chief Complaint   Patient presents with   . Vaginal Discharge     has gotten a little better since her last visit      Patient is here for follow-up after treatment.  Patient initially presented on 02/11/2017 with complaints of very malodorous tan yellow vaginal discharge that has been present for almost a year now.  Patient at that time had copious vaginal discharge and was diagnosed with bacterial vaginitis.  She also has an IUD in place.  Patient return for follow-up because her discharge did not improved at all.  At that time she did experience some pelvic pain and cramping and was noted on exam to have cervical motion tenderness and adnexal tenderness.  On 03/15/2017 patient was seen again and diagnosed with PID.  Patient was treated with Rocephin and doxycycline.    Cervical cultures were negative      Patient presents today for follow-up.  Patient denies any pelvic cramping, fevers or chills.  She reports that her discharge definitely has decreased but she still notices an odor on occasion and reporting the same discharge but just not as much.    No new sexual partners        REVIEW OF SYSTEMS :    Constitutional: Negative. Denies any fever, chills or fatigue   Gastrointestinal: Negative.  Denies any change in bowel habits, nausea or vomiting  Genitourinary:        See HPI       Patient's PMH/PSH/FH/SH/Meds/Allergies were all reviewed and noted in the EPIC chart on today's visit.    Past Medical History:   Diagnosis Date   . Anxiety    . Depression    . IBS (irritable bowel syndrome)    . Low back pain     followed by ortho in [place] and [place]         Past Surgical History:   Procedure Laterality Date   . D&C FIRST TRIMESTER / TX INCOMPLETE / MISSED /  SEPTIC / INDUCED ABORTION     . HX CHOLECYSTECTOMY     . HX ELECTIVE ABORTION     . HX UPPER ENDOSCOPY     . HX WISDOM TEETH EXTRACTION           Family Medical History     Problem Relation (Age of Onset)    Celiac Disease Mother    Diabetes Mother, Father            Current Outpatient Prescriptions   Medication Sig Dispense Refill   . meloxicam (MOBIC) 15 mg Oral Tablet TAKE 1 TABLET EVERY DAY FOR MODERATE TO SEVERE PAIN  0   . metaxalone (SKELAXIN) 800 mg Oral Tablet TAKE 1 TABLET AT BEDTIME AS NEEDED FOR SPASMS  0   . metroNIDAZOLE (FLAGYL) 500 mg Oral Tablet Take 1 Tab (500 mg total) by mouth Twice daily for 14 days 28 Tab 0     No current facility-administered medications for this visit.      No Known Allergies  Social History     Social History   . Marital status: Married     Spouse name: N/A   .  Number of children: 3   . Years of education: N/A     Occupational History   . Not on file.     Social History Main Topics   . Smoking status: Never Smoker   . Smokeless tobacco: Never Used   . Alcohol use Yes      Comment: occasionally    . Drug use: No   . Sexual activity: Yes     Partners: Male     Birth control/ protection: IUD      Comment: IUD inserted in 12/2012     Other Topics Concern   . Not on file     Social History Narrative           Objective:  BP 100/62  Pulse 76  Ht 1.753 m (5\' 9" )  Wt 132 kg (291 lb)  BMI 42.97 kg/m2  Physical Exam:    Constitutional: She is oriented. She appears well-developed and well-nourished.     HENT: Head: Normocephalic.      Abdomen: She exhibits no distension. Soft. No tenderness. She has no rebound and no guarding.     Psychiatric: She has a normal mood and affect. Her behavior is normal.     Extremities:  No edema, no calf tenderness bilaterally    Skin:  Normal, no rashes or skin lesions.    GU:  External Exam: External exam normal.    No urethral tenderness.  No genital lesions present.    Vaginal Exam:  Vagina normal to exam.    Bladder:  Bladder is normal to exam.  Suprapubic fullness not present.    Uterus: Uterus is normal to exam.  No uterine tenderness today.     Cervix is normal to exam.  IUD strings noted. There is no cervical motion tenderness today.  Contour:  Regular.    Mobility:  Mobile.    Adnexa:  No palpable masses and no tenderness.    Rectovaginal exam:  Not done        Assessment & Plan:     ICD-10-CM    1. BV (bacterial vaginosis) N76.0 metroNIDAZOLE (FLAGYL) 500 mg Oral Tablet    B96.89 US OB TRANSABDOMINAL FOLLOW UP / REPEAT   2. Follow-up exam after treatment Z09    3. Morbid obesity with BMI of 40.0-44.9, adult (HCC) E66.01 US OB TRANSABDOMINAL FOLLOW UP / REPEAT    Z68.41    4. IUD (intrauterine device) in place Z97.5 US OB TRANSABDOMINAL FOLLOW UP / REPEAT   5. Pelvic pain R10.2 US OB TRANSABDOMINAL FOLLOW UP / REPEAT   6. 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    I recommend pelvic ultrasound to rule out any endometrial pathology and confirm IUD placement.    I told her I was reluctant to move remove the IUD because she does have benefits of keeping IUD in place including pregnancy prevention and uterine protection.    I am not convinced this is the IUD at this time.    This could just be recurrent bacterial vaginosis.    Recommend a 2 week course of oral Flagyl at this time and a 1 month course of boric acid suppositories.    Infection precautions reviewed    Patient to follow up in 4 weeks for repeat exam.    The patient verbalizes a reasonable understanding of her diagnosis and is in agreement with the  plan of care.  All risks, benefits and alternatives were reviewed with the patient.  All questions were answered.       Greater that 50% of a 25 minute appointment was spent in consultation.           Orders Placed This Encounter   . KOH/WET PREP GYN PROCEDURE(AMB)   . US OB TRANSABDOMINAL FOLLOW UP / REPEAT   . metroNIDAZOLE  (FLAGYL) 500 mg Oral Tablet

## 2017-04-15 ENCOUNTER — Ambulatory Visit (HOSPITAL_BASED_OUTPATIENT_CLINIC_OR_DEPARTMENT_OTHER)
Admission: RE | Admit: 2017-04-15 | Discharge: 2017-04-15 | Disposition: A | Discharge status: Home / Self Care | Payer: 59 | Source: Ambulatory Visit | Attending: OBSTETRICS/GYNECOLOGY | Admitting: OBSTETRICS/GYNECOLOGY

## 2017-04-15 DIAGNOSIS — N760 Acute vaginitis: Secondary | ICD-10-CM

## 2017-04-15 DIAGNOSIS — R102 Pelvic and perineal pain: Secondary | ICD-10-CM | POA: Insufficient documentation

## 2017-04-15 DIAGNOSIS — Z6841 Body Mass Index (BMI) 40.0 and over, adult: Secondary | ICD-10-CM | POA: Insufficient documentation

## 2017-04-15 DIAGNOSIS — B9689 Other specified bacterial agents as the cause of diseases classified elsewhere: Secondary | ICD-10-CM | POA: Insufficient documentation

## 2017-04-15 DIAGNOSIS — Z975 Presence of (intrauterine) contraceptive device: Secondary | ICD-10-CM

## 2017-05-14 ENCOUNTER — Ambulatory Visit (INDEPENDENT_AMBULATORY_CARE_PROVIDER_SITE_OTHER): Payer: Self-pay | Admitting: Obstetrics & Gynecology

## 2017-05-20 ENCOUNTER — Ambulatory Visit (INDEPENDENT_AMBULATORY_CARE_PROVIDER_SITE_OTHER): Payer: Self-pay | Admitting: Obstetrics & Gynecology

## 2017-05-20 ENCOUNTER — Encounter (INDEPENDENT_AMBULATORY_CARE_PROVIDER_SITE_OTHER): Payer: Self-pay | Admitting: Obstetrics & Gynecology

## 2017-05-20 VITALS — BP 114/76 | HR 82 | Ht 69.0 in | Wt 293.0 lb

## 2017-05-20 DIAGNOSIS — Z029 Encounter for administrative examinations, unspecified: Secondary | ICD-10-CM

## 2017-05-22 ENCOUNTER — Ambulatory Visit (INDEPENDENT_AMBULATORY_CARE_PROVIDER_SITE_OTHER): Payer: 59

## 2017-05-22 ENCOUNTER — Encounter (INDEPENDENT_AMBULATORY_CARE_PROVIDER_SITE_OTHER): Payer: Self-pay

## 2017-05-22 VITALS — BP 90/60 | HR 84 | Temp 98.1°F | Ht 69.0 in | Wt 293.0 lb

## 2017-05-22 DIAGNOSIS — B9689 Other specified bacterial agents as the cause of diseases classified elsewhere: Secondary | ICD-10-CM

## 2017-05-22 DIAGNOSIS — N760 Acute vaginitis: Principal | ICD-10-CM

## 2017-05-22 NOTE — Progress Notes (Signed)
[facility]  [address]  [place] [address]       Name: Angelica Garcia MRN:  [id number]   Date: 05/22/2017 Age: 36 y.o.     Chief Complaint     Vaginosis F/U Patient complaining of vaginal drainage, swelling, itching, diarrhea, back pain and bad odor.           HPI:  Angelica Garcia is a 36 y.o., White female, [id number], No LMP recorded. Patient is not currently having periods (Reason: Progesterone IUD)., who comes in for   Recurrent foul-smelling vaginal discharge in spite of being treated with Flagyl on at least 1 occasion.  The patient states she still has occasional discharge although was markedly improved after the course of Flagyl.  She is concerned about its recurrence rate.  And explanations given to the patient about the organisms that lead to this problem and that they are always present within the vagina that when the pH of the vagina is elevated the bacterial grow releasing ammonia gas which gives  the odor..    Past Medical History  No current outpatient prescriptions on file.     No Known Allergies  Past Medical History:   Diagnosis Date   . Anxiety    . Depression    . IBS (irritable bowel syndrome)    . Low back pain     followed by ortho in [place] and [place]         Past Surgical History:   Procedure Laterality Date   . D&C FIRST TRIMESTER / TX INCOMPLETE / MISSED / SEPTIC / INDUCED ABORTION     . HX CHOLECYSTECTOMY     . HX ELECTIVE ABORTION     . HX UPPER ENDOSCOPY     . HX WISDOM TEETH EXTRACTION           OB History     Gravida Para Term Preterm AB Living    6 3 3  3 3     SAB TAB Ectopic Multiple Live Births    2 1           Family Medical History     Problem Relation (Age of Onset)    Celiac Disease Mother    Diabetes Mother, Father            Social History     Social History   . Marital status: Married     Spouse name: N/A   . Number of children: 3   . Years of education: N/A     Social History Main Topics   . Smoking status: Never Smoker   . Smokeless  tobacco: Never Used   . Alcohol use Yes      Comment: occasionally    . Drug use: No   . Sexual activity: Yes     Partners: Male     Birth control/ protection: IUD      Comment: IUD inserted in 12/2012     Other Topics Concern   . Not on file     Social History Narrative       Review of Systems   Constitutional: Negative for chills, fatigue, fever and unexpected weight change.   HENT: Negative for congestion.    Eyes: Negative for visual disturbance.   Respiratory: Negative for shortness of breath.    Cardiovascular: Negative for chest pain.   Gastrointestinal: Negative for abdominal distention, abdominal pain, blood in stool, diarrhea and nausea.   Endocrine: Negative for cold intolerance  and heat intolerance.   Genitourinary: Positive for vaginal discharge. Negative for decreased urine volume, difficulty urinating, dyspareunia, dysuria, frequency, pelvic pain, urgency and vaginal bleeding.   Musculoskeletal: Negative for back pain and myalgias.   Skin: Negative for rash.   Neurological: Negative for light-headedness.   Hematological: Negative for adenopathy.   Psychiatric/Behavioral: The patient is not nervous/anxious.        Physical Exam    Assessment and Plan:  (N76.0,  B96.89) BV (bacterial vaginosis)  (primary encounter diagnosis)  Plan:   The patient is instructed to use boric acid capsules in the vagina about twice a week to try to keep the pH of the vagina more acid and prevent the anaerobes from growing.  We have also advised her that if she develops symptoms again of the bacterial vaginosis that being burning itching and/or foul odor that it would be worthwhile trying a course of clindamycin rather than 5 metronidazole since she has used that in the past with some success but not long duration of success.      No orders of the defined types were placed in this encounter.          Jola Babinskiharles Roylene Heaton, MD

## 2017-06-02 NOTE — Progress Notes (Signed)
The patient did not appear for their appointment/or scheduled appointment was cancelled.  This office visit opened in error.

## 2017-11-21 ENCOUNTER — Ambulatory Visit (INDEPENDENT_AMBULATORY_CARE_PROVIDER_SITE_OTHER): Payer: 59 | Admitting: Family

## 2017-11-21 ENCOUNTER — Encounter (INDEPENDENT_AMBULATORY_CARE_PROVIDER_SITE_OTHER): Payer: Self-pay | Admitting: Family

## 2017-11-21 ENCOUNTER — Ambulatory Visit (INDEPENDENT_AMBULATORY_CARE_PROVIDER_SITE_OTHER): Payer: 59 | Attending: Family

## 2017-11-21 VITALS — BP 94/68 | HR 80 | Temp 98.4°F | Resp 16 | Ht 69.0 in | Wt 295.0 lb

## 2017-11-21 DIAGNOSIS — F341 Dysthymic disorder: Secondary | ICD-10-CM

## 2017-11-21 DIAGNOSIS — F331 Major depressive disorder, recurrent, moderate: Secondary | ICD-10-CM

## 2017-11-21 LAB — COMPREHENSIVE METABOLIC PROFILE - BMC/JMC ONLY
ALBUMIN/GLOBULIN RATIO: 1.2 (ref 0.8–2.0)
ALBUMIN: 3.8 g/dL (ref 3.5–5.0)
ALKALINE PHOSPHATASE: 57 U/L (ref 38–126)
ALT (SGPT): 26 U/L (ref 14–54)
ANION GAP: 8 mmol/L (ref 3–11)
AST (SGOT): 21 U/L (ref 15–41)
AST (SGOT): 21 U/L (ref 15–41)
BILIRUBIN TOTAL: 1.1 mg/dL (ref 0.3–1.2)
BUN/CREA RATIO: 15 (ref 6–22)
BUN: 11 mg/dL (ref 6–20)
CALCIUM: 9.5 mg/dL (ref 8.6–10.3)
CHLORIDE: 101 mmol/L (ref 101–111)
CO2 TOTAL: 26 mmol/L (ref 22–32)
CREATININE: 0.74 mg/dL (ref 0.44–1.00)
ESTIMATED GFR: >60 mL/min/1.73mˆ2 (ref >60)
GLUCOSE: 145 mg/dL — ABNORMAL HIGH (ref 70–110)
POTASSIUM: 4.5 mmol/L (ref 3.4–5.1)
PROTEIN TOTAL: 7 g/dL (ref 6.4–8.3)
SODIUM: 135 mmol/L — ABNORMAL LOW (ref 136–145)
SODIUM: 135 mmol/L — ABNORMAL LOW (ref 136–145)

## 2017-11-21 LAB — CBC W/AUTO DIFF
BASOPHIL #: 0 x10ˆ3/uL (ref 0.00–0.10)
BASOPHIL %: 1 % (ref 0–3)
EOSINOPHIL #: 0.2 x10ˆ3/uL (ref 0.00–0.50)
EOSINOPHIL %: 2 % (ref 0–5)
HCT: 45.2 % — ABNORMAL HIGH (ref 36.0–45.0)
HGB: 15.2 g/dL (ref 12.0–15.5)
LYMPHOCYTE #: 1.8 x10ˆ3/uL (ref 1.00–4.80)
LYMPHOCYTE %: 19 % (ref 15–43)
MCH: 29.3 pg (ref 27.5–33.2)
MCHC: 33.6 g/dL (ref 32.0–36.0)
MCV: 87.3 fL (ref 82.0–97.0)
MONOCYTE #: 0.5 x10ˆ3/uL (ref 0.20–0.90)
MPV: 8.3 fL (ref 7.4–10.5)
MPV: 8.3 fL (ref 7.4–10.5)
NEUTROPHIL #: 6.7 x10ˆ3/uL — ABNORMAL HIGH (ref 1.50–6.50)
NEUTROPHIL %: 73 % (ref 43–76)
PLATELETS: 370 x10ˆ3/uL (ref 150–450)
RBC: 5.18 x10ˆ6/uL — ABNORMAL HIGH (ref 4.00–5.10)
RDW: 13.5 % (ref 11.0–16.0)
WBC: 9.2 x10ˆ3/uL (ref 4.0–11.0)

## 2017-11-21 LAB — THYROID STIMULATING HORMONE WITH FREE T4 REFLEX: TSH: 2.994 u[IU]/mL (ref 0.340–5.330)

## 2017-11-21 MED ORDER — FLUOXETINE 90 MG CAPSULE,DELAYED RELEASE: 90 mg | Cap | ORAL | 0 refills | 0 days | Status: DC

## 2017-11-21 NOTE — Nursing Note (Signed)
11/21/17 0800   PHQ 9 (follow up)   Little interest or pleasure in doing things. 3   Feeling down, depressed, or hopeless 3   Trouble falling or staying asleep, or sleeping too much. 0   Feeling tired or having little energy 3   Poor appetite or overeating 1   Feeling bad about yourself/ that you are a failure in the past 2 weeks? 3   Trouble concentrating on things in the past 2 weeks? 1   Moving/Speaking slowly or being fidgety or restless  in the past 2 weeks? 1   Thoughts that you would be better off DEAD, or of hurting yourself in some way. 0   If you checked off any problems, how difficult have these problems made it for you to do your work, take care of things at home, or get along with other people? Extremely difficult   PHQ 9 Total 15   Interpretation of Total Score Moderate/Severe depression

## 2017-11-21 NOTE — Progress Notes (Signed)
[facility]  FAMILY [facility]  [address]  [place] [id number]  Dept: [phone]  Dept Fax: [phone]  Loc: [phone]  Loc Fax: [phone]    Patient name: Angelica Garcia  Age: 36 y.o. June 07, 1981  DOS: 11/21/2017      Chief Complaint: Angelica Harriesatalie Guyton is a 36 y.o. female who presents for   Chief Complaint   Patient presents with   . Depression     Patient states that she has taken Prozac in the past.       HPI/Subjective:  Patient is here for a new concern.   C/o depression, has hx of depression. Said in past her symptoms were stable with weekly Prozac and she likes to try it again. Was on prozac 12 years ago.   Said she is Having stress at work now.   Said she did try Daily zoloft but that caused heartburn.   Denies any Hi/SI, no panic attacks.   PHQ 9 is positive, score 15      Health Maintenance: Pending and Last Completed       Date Due Completion Date    HIV Screening 11/10/1994 ---    Adult Tdap-Td (1 - Tdap) 11/10/1998 ---    Influenza Vaccine (1) 07/27/2017 01/18/2017    Depression Screening 01/18/2018 01/18/2017    Pap smear 02/12/2020 02/11/2017        PHQ Questionnaire  Little interest or pleasure in doing things.: Nearly every day  Feeling down, depressed, or hopeless: Nearly every day  Trouble falling or staying asleep, or sleeping too much.: Not at all  Feeling tired or having little energy: Nearly every day  Poor appetite or overeating: Several days  Feeling bad about yourself/ that you are a failure in the past 2 weeks?: Nearly every day  Trouble concentrating on things in the past 2 weeks?: Several days  Moving/Speaking slowly or being fidgety or restless  in the past 2 weeks?: Several days  Thoughts that you would be better off DEAD, or of hurting yourself in some way.: Not at all  PHQ 9 Total: 15  Interpretation of Total Score: 15-19 Moderate/Severe depression  If you checked off any problems, how difficult have these problems made it for you to do your  work, take care of things at home, or get along with other people?: Extremely difficult    Review of Systems:   Constitutional: no recent weight loss, no fever, no chills.  HEENT: Negative  Cardiovascular: no chest pain, no chest pressure/discomfort, no palpitations, no near-syncope or syncope  Respiratory: no cough, no wheezing, no SOB.   GI: no abdominal pain, no nausea, no vomiting, no constipation, no diarrhea, no bloody stools  Emotional/Pscychiatric: no anxiety, ++ depression, no insomnia    No Known Allergies  Social History     Tobacco Use   . Smoking status: Never Smoker   . Smokeless tobacco: Never Used   Substance Use Topics   . Alcohol use: Yes     Comment: occasionally    . Drug use: No     Social History     Substance and Sexual Activity   Drug Use No     PMH, PSH, and Fam Hx reviewed    Physical Exam:  VS (completed by my clinic staff): BP 94/68   Pulse 80   Temp 36.9 C (98.4 F) (Oral)   Resp 16   Ht 1.753 m (5\' 9" )   Wt 133.8 kg (295 lb)  BMI 43.56 kg/m        Physical Exam:  General: Patient is 36 y.o., female, well dressed, in no acute distress. ++obese.   Heart: RRR. No murmur. LE: no edema  Lungs: clear to auscultation. Unlabored breathing  Abdomen: normal bowel sounds, non-tender, and non-distended. No masses.   Musculoskeletal/Neuro: Gait normal.   Mental Status: alert, oriented to person, place, and time. Normal mood, and behavior.       Assessment and Plan:    ICD-10-CM    1. Dysthymia F34.1 CBC W/AUTO DIFF - [facility] ONLY     COMPREHENSIVE METABOLIC PROFILE - [facility] ONLY     THYROID STIMULATING HORMONE WITH FREE T4 REFLEX     Fluoxetine 90 mg Oral Capsule, Delayed Release(E.C.)     Letonia was seen today for depression.    Diagnoses and all orders for this visit:    Dysthymia  Uncontrolled. Rx prozac as below, SE discussed. Ordered labs as below, will f/u with results. RTC in 2 weeks or early if needed.   -     CBC W/AUTO DIFF - [facility] ONLY; Future  -     COMPREHENSIVE METABOLIC PROFILE -  [facility] ONLY; Future  -     THYROID STIMULATING HORMONE WITH FREE T4 REFLEX; Future  -     Fluoxetine 90 mg Oral Capsule, Delayed Release(E.C.); Take 1 Cap (90 mg total) by mouth Every 7 days      BMI addressed: Advised on diet, weight loss, and exercise to reduce above normal BMI.    Follow up: Return in about 2 weeks (around 12/05/2017) for Follow Up, Medicine refill.      Outpatient Medications Marked as Taking for the 11/21/17 encounter (Office Visit) with Pearlie Oyster, APRN   Medication Sig   . Fluoxetine 90 mg Oral Capsule, Delayed Release(E.C.) Take 1 Cap (90 mg total) by mouth Every 7 days       LABS:     Comprehensive Metabolic Profile    Lab Results   Component Value Date/Time    SODIUM 138 01/18/2017 09:30 AM    POTASSIUM 4.6 01/18/2017 09:30 AM    CHLORIDE 103 01/18/2017 09:30 AM    CO2 28 01/18/2017 09:30 AM    ANIONGAP 7 01/18/2017 09:30 AM    BUN 12 01/18/2017 09:30 AM    CREATININE 0.69 01/18/2017 09:30 AM    Lab Results   Component Value Date/Time    CALCIUM 9.2 01/18/2017 09:30 AM    ALBUMIN 3.5 01/18/2017 09:30 AM    TOTALPROTEIN 6.6 01/18/2017 09:30 AM    ALKPHOS 55 01/18/2017 09:30 AM    AST 19 01/18/2017 09:30 AM    ALT 20 01/18/2017 09:30 AM    TOTBILIRUBIN 0.9 01/18/2017 09:30 AM                     CBC  Diff   Lab Results   Component Value Date/Time    WBC 6.5 01/18/2017 09:30 AM    HGB 13.7 01/18/2017 09:30 AM    HCT 41.8 01/18/2017 09:30 AM    PLTCNT 287 01/18/2017 09:30 AM    RBC 4.77 01/18/2017 09:30 AM    MCV 87.5 01/18/2017 09:30 AM    MCHC 32.9 01/18/2017 09:30 AM    MCH 28.8 01/18/2017 09:30 AM    RDW 13.9 01/18/2017 09:30 AM    MPV 8.6 01/18/2017 09:30 AM    Lab Results   Component Value Date/Time    PMNS 71 01/18/2017 09:30 AM  LYMPHOCYTES 20 01/18/2017 09:30 AM    EOSINOPHIL 2 01/18/2017 09:30 AM    MONOCYTES 6 01/18/2017 09:30 AM    BASOPHILS 1 01/18/2017 09:30 AM    BASOPHILS 0.00 01/18/2017 09:30 AM    PMNABS 4.70 01/18/2017 09:30 AM    LYMPHSABS 1.30 01/18/2017 09:30 AM     EOSABS 0.10 01/18/2017 09:30 AM    MONOSABS 0.40 01/18/2017 09:30 AM            Lab Results   Component Value Date    TSH 1.486 01/18/2017           Lab Results   Component Value Date    CHOLESTEROL 149 01/18/2017    HDLCHOL 35 (L) 01/18/2017    LDLCHOL 99 01/18/2017    TRIG 75 01/18/2017        Collaborative/Supervising Physician: Dr. Mack GuiseAmanda Prince    Elanora Quin, APRN    Portions of this note may be dictated using voice recognition software . Variances in spelling and vocabulary are possible and unintentional. Not all errors are caught/corrected. Please notify the [facility] if any discrepancies are noted or if the meaning of any statement is not clear.

## 2017-11-21 NOTE — Nursing Note (Signed)
Chief Complaint:   Depression (Patient states that she has taken Prozac in the past. )    Functional Health Screen        Vital Signs  BP 94/68   Pulse 80   Temp 36.9 C (98.4 F) (Oral)   Resp 16   Ht 1.753 m (5\' 9" )   Wt 133.8 kg (295 lb)   BMI 43.56 kg/m       Social History     Tobacco Use   Smoking Status Never Smoker   Smokeless Tobacco Never Used     Allergies  No Known Allergies  Medication History  Reviewed for OTC medication and any new medications, provider will review medication history  Care Team  Patient Care Team:  Pearlie Oysterhindsa, Mandeep, APRN as PCP - General (NURSE PRACTITIONER)  Immunizations - last 24 hours     None        Nursing Notes:   Katrinka BlazingMoyer, Tashawn Greff, LPN  54/07/8111/27/18 [id number]  Signed     11/21/17 0800   PHQ 9 (follow up)   Little interest or pleasure in doing things. 3   Feeling down, depressed, or hopeless 3   Trouble falling or staying asleep, or sleeping too much. 0   Feeling tired or having little energy 3   Poor appetite or overeating 1   Feeling bad about yourself/ that you are a failure in the past 2 weeks? 3   Trouble concentrating on things in the past 2 weeks? 1   Moving/Speaking slowly or being fidgety or restless  in the past 2 weeks? 1   Thoughts that you would be better off DEAD, or of hurting yourself in some way. 0   If you checked off any problems, how difficult have these problems made it for you to do your work, take care of things at home, or get along with other people? Extremely difficult   PHQ 9 Total 15   Interpretation of Total Score Moderate/Severe depression     Katrinka BlazingJennifer Arraya Buck, LPN  78/29/562112/27/2018, 08:19

## 2017-12-05 ENCOUNTER — Ambulatory Visit (HOSPITAL_BASED_OUTPATIENT_CLINIC_OR_DEPARTMENT_OTHER): Payer: 59 | Attending: OBSTETRICS/GYNECOLOGY

## 2017-12-05 ENCOUNTER — Ambulatory Visit (INDEPENDENT_AMBULATORY_CARE_PROVIDER_SITE_OTHER): Payer: 59 | Admitting: OBSTETRICS/GYNECOLOGY

## 2017-12-05 ENCOUNTER — Encounter (INDEPENDENT_AMBULATORY_CARE_PROVIDER_SITE_OTHER): Payer: Self-pay | Admitting: OBSTETRICS/GYNECOLOGY

## 2017-12-05 VITALS — BP 108/70 | Ht 69.0 in | Wt 293.4 lb

## 2017-12-05 DIAGNOSIS — Z6841 Body Mass Index (BMI) 40.0 and over, adult: Secondary | ICD-10-CM

## 2017-12-05 DIAGNOSIS — Z131 Encounter for screening for diabetes mellitus: Secondary | ICD-10-CM | POA: Insufficient documentation

## 2017-12-05 DIAGNOSIS — Z833 Family history of diabetes mellitus: Secondary | ICD-10-CM | POA: Insufficient documentation

## 2017-12-05 DIAGNOSIS — B9689 Other specified bacterial agents as the cause of diseases classified elsewhere: Secondary | ICD-10-CM

## 2017-12-05 DIAGNOSIS — N760 Acute vaginitis: Principal | ICD-10-CM

## 2017-12-05 MED ORDER — METRONIDAZOLE 0.75 % (37.5 MG/5 GRAM) VAGINAL GEL
VAGINAL | 5 refills | Status: AC
Start: 2017-12-05 — End: ?

## 2017-12-05 NOTE — Patient Instructions (Signed)
Boric Acid Suppositories for 30 days  [employer]

## 2017-12-05 NOTE — Progress Notes (Signed)
[facility]  [address]  [place]  [id number]  [phone]        Name of Patient: Angelica Garcia  DOB: [birth year]  MRN: [id number]    Date of Service: 12/05/2017        Subjective:    37 y.o. [id number]     Chief Complaint   Patient presents with   . Other     Pt is here to talk about possibly geting a tubal.      Patient is here would like to discuss again contraceptive options as well as permanent sterilization.  She has the IUD in place now but has had issues with recurrent bacterial vaginitis and is wondering if the IUD has something to do with this.  Patient also reports amenorrhea since having the IUD in place and she is pleased with that.  She denies any pelvic pain or pain with intercourse.  She is sexually active but rarely.    She wanted to talk about surgical sterilization again today        REVIEW OF SYSTEMS :    Constitutional: Negative. Denies any fever, chills or fatigue   Gastrointestinal: Negative.  Denies any change in bowel habits, nausea or vomiting  Genitourinary:        See HPI       Patient's PMH/PSH/FH/SH/Meds/Allergies were all reviewed and noted in the EPIC chart on today's visit.    Past Medical History:   Diagnosis Date   . Anxiety    . Depression    . IBS (irritable bowel syndrome)    . Low back pain     followed by ortho in [place] and [facility]         Past Surgical History:   Procedure Laterality Date   . D&C FIRST TRIMESTER / TX INCOMPLETE / MISSED / SEPTIC / INDUCED ABORTION     . HX CHOLECYSTECTOMY     . HX ELECTIVE ABORTION     . HX UPPER ENDOSCOPY     . HX WISDOM TEETH EXTRACTION           Family Medical History:     Problem Relation (Age of Onset)    Celiac Disease Mother    Diabetes Mother, Father            Current Outpatient Medications   Medication Sig Dispense Refill   . Fluoxetine 90 mg Oral Capsule, Delayed Release(E.C.) Take 1 Cap (90 mg total) by mouth Every 7 days 4 Cap 0   . levonorgestrel (MIRENA) 20 mcg/24 hr  intrauterine device by Intrauterine route One time     . metroNIDAZOLE (METROGEL-VAGINAL) 0.75 % Vaginal Gel Use nightly vaginally for 10 days and then twice weekly for 6 months 1 Tube 5     No current facility-administered medications for this visit.      No Known Allergies  Social History     Socioeconomic History   . Marital status: Married     Spouse name: Not on file   . Number of children: 3   . Years of education: Not on file   . Highest education level: Not on file   Social Needs   . Financial resource strain: Not on file   . Food insecurity - worry: Not on file   . Food insecurity - inability: Not on file   . Transportation needs - medical: Not on file   . Transportation needs - non-medical: Not on file   Occupational  History   . Not on file   Tobacco Use   . Smoking status: Never Smoker   . Smokeless tobacco: Never Used   Substance and Sexual Activity   . Alcohol use: Yes     Comment: occasionally    . Drug use: No   . Sexual activity: Yes     Partners: Male     Birth control/protection: IUD     Comment: IUD inserted in 12/2012   Other Topics Concern   . Not on file   Social History Narrative   . Not on file           Objective:  BP 108/70   Ht 1.753 m (5\' 9" )   Wt 133.1 kg (293 lb 6.4 oz)   BMI 43.33 kg/m       Physical Exam:    Constitutional: She is oriented. She appears well-developed and well-nourished.     HENT: Head: Normocephalic.      Abdomen: She exhibits no distension. Soft. No tenderness. She has no rebound and no guarding.     Psychiatric: She has a normal mood and affect. Her behavior is normal.     Extremities:  No edema, no calf tenderness bilaterally    Skin:  Normal, no rashes or skin lesions.    GU:  External Exam: External exam normal.    No urethral tenderness.  No genital lesions present.    Vaginal Exam:  Vagina normal to exam.     The again there is copious malodorous vaginal discharge present.  Bladder:  Bladder is normal to exam. Suprapubic fullness not present.    Uterus:  Uterus is normal to exam.  Cervix is normal to exam.   IUD string is noted.  There is no cervical discharge or cervical motion tenderness noted.  Position:   Difficult to assess   Contour:  Regular.    Mobility:  Mobile.    Adnexa:  No palpable masses and no tenderness.    Rectovaginal exam:   Not done    Assessment & Plan:     ICD-10-CM    1. BV (bacterial vaginosis) N76.0 metroNIDAZOLE (METROGEL-VAGINAL) 0.75 % Vaginal Gel    B96.89    2. Screening for diabetes mellitus Z13.1 HGA1C (HEMOGLOBIN A1C WITH EST AVG GLUCOSE)   3. Family history of diabetes mellitus Z83.3 HGA1C (HEMOGLOBIN A1C WITH EST AVG GLUCOSE)   4. Morbid obesity with BMI of 40.0-44.9, adult (CMS HCC) E66.01 Refer to [facility]    [phone]      Reviewed physical exam findings again consistent with bacterial vaginitis.  This is absolutely recurrent infection at this point she has had more than 3 vaginitis infections diagnosed in the last 12 months.  Recommend treating for recurrent infection with a full course of topical Metrogel for 10 days and then twice weekly for the next 6 months.      We reviewed surgical sterilization  And risks associated with this including but not limited to bleeding infection damage to surrounding structures difficulty with surgical procedure secondary to her morbid obesity and truncal obesity.  Also risks of blood clot stroke heart attack and anesthesia risks including pneumonia.      Contraceptive counseling done today. Discussed all options, OCP, Depo Provera, Nexplanon, IUD's, Nuva Ring as well as partner vasectomy. All R/B and SE reviewed at length.   Patient would like to keep her IUD at this time we will replace it when expires in March of  2019.      Patient also is interested in trying to lose weight she is tried on her own has been unsuccessful.  Recommend referral to the [facility].      Extensive amount of time was spent talking to this patient about surgical  risks versus medical management and continued therapy with Mirena IUD.      Greater that 50% of a 40 minute appointment was spent in consultation.             Orders Placed This Encounter   . HGA1C (HEMOGLOBIN A1C WITH EST AVG GLUCOSE)   . Refer to [facility]   . metroNIDAZOLE (METROGEL-VAGINAL) 0.75 % Vaginal Gel       No follow-ups on file.          Portions of this note may be dictated using voice recognition software. Variances in spelling/vocabulary/word placement are possible and unintentional. Not all errors are caught/corrected. Please notify the [facility] Parkin if any discrepancies are noted or if the meaning of any statement needs clarification.

## 2017-12-06 ENCOUNTER — Encounter (INDEPENDENT_AMBULATORY_CARE_PROVIDER_SITE_OTHER): Payer: Self-pay | Admitting: OBSTETRICS/GYNECOLOGY

## 2017-12-06 DIAGNOSIS — E119 Type 2 diabetes mellitus without complications: Secondary | ICD-10-CM

## 2017-12-06 HISTORY — DX: Type 2 diabetes mellitus without complications: E11.9

## 2017-12-06 LAB — HGA1C (HEMOGLOBIN A1C WITH EST AVG GLUCOSE)
ESTIMATED AVERAGE GLUCOSE: 140 mg/dL — ABNORMAL HIGH (ref 70–110)
GLYCOHEMOGLOBIN (HBA1C): 6.5 % — ABNORMAL HIGH (ref 4.0–6.0)

## 2017-12-11 ENCOUNTER — Encounter (INDEPENDENT_AMBULATORY_CARE_PROVIDER_SITE_OTHER): Payer: Self-pay | Admitting: Family

## 2017-12-11 ENCOUNTER — Ambulatory Visit (INDEPENDENT_AMBULATORY_CARE_PROVIDER_SITE_OTHER): Payer: 59 | Admitting: Family

## 2017-12-11 VITALS — BP 104/70 | HR 68 | Temp 98.8°F | Resp 18 | Ht 69.0 in | Wt 293.8 lb

## 2017-12-11 DIAGNOSIS — F419 Anxiety disorder, unspecified: Secondary | ICD-10-CM

## 2017-12-11 DIAGNOSIS — F341 Dysthymic disorder: Secondary | ICD-10-CM

## 2017-12-11 HISTORY — DX: Anxiety disorder, unspecified: F41.9

## 2017-12-11 HISTORY — DX: Dysthymic disorder: F34.1

## 2017-12-11 MED ORDER — FLUOXETINE 90 MG CAPSULE,DELAYED RELEASE
90.00 mg | DELAYED_RELEASE_CAPSULE | ORAL | 2 refills | Status: DC
Start: 2017-12-11 — End: 2018-03-06

## 2017-12-11 NOTE — Nursing Note (Signed)
Chief Complaint:   Medication Refill    Functional Health Screen        Vital Signs  BP 104/70   Pulse 68   Temp 37.1 C (98.8 F) (Oral)   Resp 18   Ht 1.753 m (5\' 9" )   Wt 133.3 kg (293 lb 12.8 oz)   SpO2 98%   Breastfeeding? No   BMI 43.39 kg/m       Social History     Tobacco Use   Smoking Status Never Smoker   Smokeless Tobacco Never Used     Allergies  No Known Allergies  Medication History  Reviewed for OTC medication and any new medications, provider will review medication history  Care Team  Patient Care Team:  Pearlie Oysterhindsa, Mandeep, APRN as PCP - General (NURSE PRACTITIONER)  Immunizations - last 24 hours     None        There are no exam notes on file for this visit.  Katrinka BlazingJennifer Wayman Hoard, LPN  1/30/86571/16/2019, 13:21

## 2017-12-11 NOTE — Progress Notes (Signed)
[facility]  FAMILY MEDICINE, [facility]  [address]  [place] [address]  Dept: [phone]  Dept Fax: [phone]  Loc: [phone]  Loc Fax: [phone]    Patient name: Angelica Garcia  Age: 37 y.o. 12/22/1980  DOS: 12/11/2017      Chief Complaint: Angelica Garcia is a 37 y.o. female who presents for   Chief Complaint   Patient presents with   . Medication Refill      HPI/Subjective:  Patient is here for follow up on depression.   Patient was started on weekly Prozac last month.  Patient reports taking medicine as prescribed with good result.  Denies any HI/SI, no panic attacks.    11/21/17  Patient is here for a new concern.   C/o depression, has hx of depression. Said in past her symptoms were stable with weekly Prozac and she likes to try it again. Was on prozac 12 years ago.   Said she is Having stress at work now.   Said she did try Daily zoloft but that caused heartburn.   Denies any Hi/SI, no panic attacks.   PHQ 9 is positive, score 15    Health Maintenance: Pending and Last Completed       Date Due Completion Date    HIV Screening 11/10/1994 ---    Adult Tdap-Td (1 - Tdap) 11/10/1998 ---    Pneumococcal 19-64 Years Medium Risk (1 of 1 - PPSV23) 11/10/1998 ---    Influenza Vaccine (1) 07/27/2017 01/18/2017    Pap smear 02/12/2020 02/11/2017        PHQ Questionnaire       Review of Systems:   Constitutional: no recent weight loss, no fever, no chills.  Cardiovascular: no chest pain, no chest pressure/discomfort, no palpitations, no near-syncope or syncope  Respiratory: no cough, no wheezing, no SOB.   GI: no abdominal pain, no nausea, no vomiting, no constipation, no diarrhea, no bloody stools  Emotional/Pscychiatric: no anxiety, ++ depression, no insomnia      No Known Allergies  Social History     Tobacco Use   . Smoking status: Never Smoker   . Smokeless tobacco: Never Used   Substance Use Topics   . Alcohol use: Yes     Comment: occasionally    . Drug use: No     Social  History     Substance and Sexual Activity   Drug Use No     PMH, PSH, and Fam Hx reviewed    Physical Exam:  VS (completed by my clinic staff): BP 104/70   Pulse 68   Temp 37.1 C (98.8 F) (Oral)   Resp 18   Ht 1.753 m (5\' 9" )   Wt 133.3 kg (293 lb 12.8 oz)   SpO2 98%   Breastfeeding? No   BMI 43.39 kg/m     Physical Exam:  General: Patient is 37 y.o., female, well dressed, in no acute distress.   Heart: RRR. No murmur. LE: no edema  Lungs: clear to auscultation. Unlabored breathing  Mental Status: alert, oriented to person, place, and time. Normal mood, and behavior.       Assessment and Plan:    ICD-10-CM    1. Dysthymia F34.1 Fluoxetine 90 mg Oral Capsule, Delayed Release(E.C.)     Angelica Garcia was seen today for medication refill.    Diagnoses and all orders for this visit:    Dysthymia  Controlled with current medicine. Refill given as below. Potential SE Discussed.   -  Fluoxetine 90 mg Oral Capsule, Delayed Release(E.C.); Take 1 Cap (90 mg total) by mouth Every 7 days      Follow up: Return in about 3 months (around 03/11/2018) for Follow Up, Medicine refill.    Outpatient Medications Marked as Taking for the 12/11/17 encounter (Office Visit) with Angelica Garcia, Angelica Sangha, APRN   Medication Sig   . Fluoxetine 90 mg Oral Capsule, Delayed Release(E.C.) Take 1 Cap (90 mg total) by mouth Every 7 days   . levonorgestrel (MIRENA) 20 mcg/24 hr intrauterine device by Intrauterine route One time   . metroNIDAZOLE (METROGEL-VAGINAL) 0.75 % Vaginal Gel Use nightly vaginally for 10 days and then twice weekly for 6 months       LABS:     Comprehensive Metabolic Profile    Lab Results   Component Value Date/Time    SODIUM 135 (L) 11/21/2017 08:54 AM    POTASSIUM 4.5 11/21/2017 08:54 AM    CHLORIDE 101 11/21/2017 08:54 AM    CO2 26 11/21/2017 08:54 AM    ANIONGAP 8 11/21/2017 08:54 AM    BUN 11 11/21/2017 08:54 AM    CREATININE 0.74 11/21/2017 08:54 AM    Lab Results   Component Value Date/Time    CALCIUM 9.5 11/21/2017 08:54  AM    ALBUMIN 3.8 11/21/2017 08:54 AM    TOTALPROTEIN 7.0 11/21/2017 08:54 AM    ALKPHOS 57 11/21/2017 08:54 AM    AST 21 11/21/2017 08:54 AM    ALT 26 11/21/2017 08:54 AM    TOTBILIRUBIN 1.1 11/21/2017 08:54 AM                     CBC  Diff   Lab Results   Component Value Date/Time    WBC 9.2 11/21/2017 08:54 AM    HGB 15.2 11/21/2017 08:54 AM    HCT 45.2 (H) 11/21/2017 08:54 AM    PLTCNT 370 11/21/2017 08:54 AM    RBC 5.18 (H) 11/21/2017 08:54 AM    MCV 87.3 11/21/2017 08:54 AM    MCHC 33.6 11/21/2017 08:54 AM    MCH 29.3 11/21/2017 08:54 AM    RDW 13.5 11/21/2017 08:54 AM    MPV 8.3 11/21/2017 08:54 AM    Lab Results   Component Value Date/Time    PMNS 73 11/21/2017 08:54 AM    LYMPHOCYTES 19 11/21/2017 08:54 AM    EOSINOPHIL 2 11/21/2017 08:54 AM    MONOCYTES 5 11/21/2017 08:54 AM    BASOPHILS 1 11/21/2017 08:54 AM    BASOPHILS 0.00 11/21/2017 08:54 AM    PMNABS 6.70 (H) 11/21/2017 08:54 AM    LYMPHSABS 1.80 11/21/2017 08:54 AM    EOSABS 0.20 11/21/2017 08:54 AM    MONOSABS 0.50 11/21/2017 08:54 AM            Lab Results   Component Value Date    TSH 2.994 11/21/2017           Lab Results   Component Value Date    CHOLESTEROL 149 01/18/2017    HDLCHOL 35 (L) 01/18/2017    LDLCHOL 99 01/18/2017    TRIG 75 01/18/2017        Collaborative/Supervising Physician: Dr. Mack GuiseAmanda Garcia    Angelica Nuzum, APRN    Portions of this note may be dictated using voice recognition software . Variances in spelling and vocabulary are possible and unintentional. Not all errors are caught/corrected. Please notify the Thereasa Parkinauthor if any discrepancies are noted or if the meaning of any statement is  not clear.

## 2017-12-16 ENCOUNTER — Encounter (HOSPITAL_BASED_OUTPATIENT_CLINIC_OR_DEPARTMENT_OTHER): Payer: Self-pay

## 2017-12-16 ENCOUNTER — Emergency Department (HOSPITAL_BASED_OUTPATIENT_CLINIC_OR_DEPARTMENT_OTHER)
Admission: EM | Admit: 2017-12-16 | Discharge: 2017-12-16 | Disposition: A | Discharge status: Home / Self Care | Payer: 59 | Attending: Emergency Medicine | Admitting: Emergency Medicine

## 2017-12-16 ENCOUNTER — Other Ambulatory Visit: Payer: Self-pay

## 2017-12-16 DIAGNOSIS — M545 Low back pain, unspecified: Secondary | ICD-10-CM

## 2017-12-16 DIAGNOSIS — Z6841 Body Mass Index (BMI) 40.0 and over, adult: Secondary | ICD-10-CM | POA: Insufficient documentation

## 2017-12-16 DIAGNOSIS — Z975 Presence of (intrauterine) contraceptive device: Secondary | ICD-10-CM | POA: Insufficient documentation

## 2017-12-16 DIAGNOSIS — Z79899 Other long term (current) drug therapy: Secondary | ICD-10-CM | POA: Insufficient documentation

## 2017-12-16 DIAGNOSIS — F329 Major depressive disorder, single episode, unspecified: Secondary | ICD-10-CM | POA: Insufficient documentation

## 2017-12-16 HISTORY — DX: Body Mass Index (BMI) 40.0 and over, adult: Z684

## 2017-12-16 MED ORDER — DIAZEPAM 5 MG TABLET
5.0000 mg | ORAL_TABLET | ORAL | Status: AC
Start: 2017-12-16 — End: 2017-12-16
  Administered 2017-12-16: 5 mg via ORAL
  Filled 2017-12-16: qty 1

## 2017-12-16 MED ORDER — DIAZEPAM 5 MG TABLET
5.00 mg | ORAL_TABLET | Freq: Four times a day (QID) | ORAL | 0 refills | Status: AC | PRN
Start: 2017-12-16 — End: ?

## 2017-12-16 NOTE — ED Triage Notes (Signed)
Lower back pain started at 1000 today. Pain getting worse throughout the night. Reports SOB with the pain. Pain radiates down right leg.  Pt uncomfortable in triage.  Denies loss of bowel or bladder function.

## 2017-12-16 NOTE — ED Nurses Note (Signed)
Patient discharged home with family.  AVS reviewed with patient/care giver.  A written copy of the AVS and discharge instructions was given to the patient/care giver.  Questions sufficiently answered as needed.  Patient/care giver encouraged to follow up with PCP as indicated.  In the event of an emergency, patient/care giver instructed to call 911 or go to the nearest emergency room.      Current Discharge Medication List      START taking these medications.      Details   diazePAM 5 mg Tablet  Commonly known as:  VALIUM   5 mg, Oral, EVERY 6 HOURS PRN  Qty:  20 Tab  Refills:  0        CONTINUE these medications - NO CHANGES were made during your visit.      Details   Fluoxetine 90 mg Capsule, Delayed Release(E.C.)   90 mg, Oral, EVERY 7 DAYS  Qty:  4 Cap  Refills:  2     levonorgestrel 20 mcg/24 hr (5 years) intrauterine device  Commonly known as:  MIRENA   Intrauterine, ONCE  Refills:  0     metroNIDAZOLE 0.75 % Gel  Commonly known as:  METROGEL-VAGINAL   Use nightly vaginally for 10 days and then twice weekly for 6 months  Qty:  1 Tube  Refills:  5

## 2017-12-16 NOTE — ED Provider Notes (Signed)
[facility]  Emergency Department Visit Note    Date:  12/16/2017  Primary care provider:  Pearlie Oyster, APRN  Means of arrival:  private car  History obtained from: patient  History limited by: none    Chief Complaint: Back pain     HISTORY OF PRESENT ILLNESS     Angelica Garcia, date of birth 1981/07/26, is a 37 y.o. female who presents to the Emergency Department complaining of constant low back pain that began about 11 hours ago and has consistently worsened. The patient reports she did some local driving prior to her pain beginning. She notes a history of low back pain after pregnancies. The patient localizes pain to the low back with radiation down the right leg. She complains of diarrhea and shortness of breath but denies fever, vomiting, incontinence, urinary changes, hematochezia, or hematemesis. The patient reports taking Meloxicam and Metaxolone and doing stretches without relief.     REVIEW OF SYSTEMS     The pertinent positive and negative symptoms are as per HPI. All other systems reviewed and are negative.     PATIENT HISTORY     Past Medical History:  Past Medical History:   Diagnosis Date   . Anxiety    . Depression    . IBS (irritable bowel syndrome)    . Low back pain     followed by ortho in [place] and [facility]   . Morbid obesity with BMI of 40.0-44.9, adult (CMS [facility])        Past Surgical History:  Past Surgical History:   Procedure Laterality Date   . D&c first trimester / tx incomplete / missed / septic / induced abortion     . Hx cholecystectomy     . Hx elective abortion     . Hx upper endoscopy     . Hx wisdom teeth extraction         Family History:  No history of acute family illness given at this time.     Social History:  Social History     Tobacco Use   . Smoking status: Never Smoker   . Smokeless tobacco: Never Used   Substance Use Topics   . Alcohol use: Yes     Comment: occasionally    . Drug use: No     Social History     Substance and Sexual  Activity   Drug Use No       Medications:  Outpatient Medications Marked as Taking for the 12/16/17 encounter [facility] Encounter)   Medication Sig   . Fluoxetine 90 mg Oral Capsule, Delayed Release(E.C.) Take 1 Cap (90 mg total) by mouth Every 7 days   . levonorgestrel (MIRENA) 20 mcg/24 hr intrauterine device by Intrauterine route One time       Allergies:  No Known Allergies    PHYSICAL EXAM     Vitals:  Filed Vitals:    12/16/17 1850   BP: 117/75   Pulse: 92   Resp: 20   Temp: 36.5 C (97.7 F)   SpO2: 99%       Pulse ox  99% on None (Room Air) interpreted by me as: Normal    General: Awake. Alert. Obese. Reclined supine on the bed. no acute distress.  Head:  Atraumatic     Eyes: Anicteric sclera, noninjected conjunctiva.  PERRL. EOMI.  ENT: Oropharynx patent, pink, moist, no erythema, exudates or asymmetry. No stridor.   Neck: Neck is supple,  nontender, no adenopathy. No nuchal rigidity.   Lungs: Clear to auscultation bilaterally. No wheezes, rales or rhonchi.  no respiratory distress.  Cardiovascular:  Heart is regular without murmurs rubs or gallops   Abdomen:  Soft, nontender, non-distended, normoactive bowel sounds. No peritoneal signs. No pulsatile mass.  Extremities:  No cyanosis, edema, tenderness or asymmetry.  Skin: Skin warm, dry and well-perfused. No petechia or erythema.   Back:  Positive tenderness in the left lower lumbar muscles.    EHL 5/5 bilateral   Plantar flexion 5/5 bilateral   Lower extremity sensory function:  Normal  bilateral   SLR negative bilaterally.   Neurologic: Awake, alert, no lethargy, somnolence or confusion. Moves upper and lower extremities without focal deficits.   Psychiatric: Answers questions appropriately.    ED PROGRESS NOTE / MEDICAL DECISION MAKING     Old records reviewed by me:  I reviewed prior available records and pertinent past medical history.     Orders Placed This Encounter   . diazePAM (VALIUM) tablet     2056: Initial evaluation complete at this time. I have  discussed with the patient that her symptoms are likely related to an acute muscle spasm related to her previous history of pain. I counseled the patient to try to rest at home and to go back to physical therapy. I explained the plan to treat with Valium PO here and discharge with prescription for same. I had a detailed conversation with the patient about the risks and benefits of benzodiazepine medications and that there is a risk for sedation and addiction and that she cannot take alcohol or drive with this medication. The patient indicated understanding.   I instructed the patient to not drink ETOH while taking this medication. I discussed the diagnosis, disposition, and follow-up plan. The patient understood and is in accordance with the treatment plan at this time. Patient is to return here to the Emergency Department if new or worsening symptoms appear. All of their questions have been answered to their satisfaction. The patient is in stable condition at the time of discharge.    MIPS     Not applicable     OPIATE PRESCRIPTION      Not applicable    CORE MEASURES     Not applicable    CRITICAL CARE TIME     Not applicable     PRE-DISPOSITION VITALS      Pre-Disposition Vitals:  Filed Vitals:    12/16/17 1850   BP: 117/75   Pulse: 92   Resp: 20   Temp: 36.5 C (97.7 F)   SpO2: 99%     CLINICAL IMPRESSION     Encounter Diagnosis   Name Primary?   . Acute left-sided low back pain without sciatica Yes     DISPOSITION/PLAN     Discharged        Prescriptions:      Details   diazePAM (VALIUM) 5 mg Oral Tablet Take 1 Tab (5 mg total) by mouth Every 6 hours as needed for Anxiety (muscle spasm)  Qty: 20 Tab, Refills: 0       Follow-Up:     Pearlie Oyster, APRN  [address]  [address]  [place] [address]  [phone]  In 1 week    [facility]  [address]  [place] [address]  [phone]  Return right away, if symptoms worsen      Condition at Disposition: Stable  SCRIBE ATTESTATION STATEMENT  I Jobie QuakerMahayla Moore, SCRIBE scribed for Kennon RoundsGlass, Ilina Xu, DO on 12/16/2017 at 8:53 PM.     Documentation assistance provided for Kennon RoundsGlass, Tripton Ned, DO  by Jobie QuakerMahayla Moore, SCRIBE. Information recorded by the scribe was done at my direction and has been reviewed and validated by me Kennon RoundsGlass, Shaterria Sager, DO.

## 2017-12-25 ENCOUNTER — Encounter (INDEPENDENT_AMBULATORY_CARE_PROVIDER_SITE_OTHER): Payer: Self-pay | Admitting: Family Medicine

## 2017-12-25 ENCOUNTER — Ambulatory Visit (INDEPENDENT_AMBULATORY_CARE_PROVIDER_SITE_OTHER): Payer: 59 | Admitting: Family Medicine

## 2017-12-25 DIAGNOSIS — E119 Type 2 diabetes mellitus without complications: Secondary | ICD-10-CM

## 2017-12-25 DIAGNOSIS — Z713 Dietary counseling and surveillance: Secondary | ICD-10-CM

## 2017-12-25 DIAGNOSIS — Z6841 Body Mass Index (BMI) 40.0 and over, adult: Secondary | ICD-10-CM

## 2017-12-25 NOTE — H&P (Signed)
[facility]  [address]  [place] [address]  [phone]    SUBJECTIVE:   Angelica Garcia is a 37 y.o. female  Her goal: specifically is to lose weight and feel better   Referral:  Loreen Freud NP  Primary care:  Same  Meds:   Current Outpatient Medications   Medication Sig   . diazePAM (VALIUM) 5 mg Oral Tablet Take 1 Tab (5 mg total) by mouth Every 6 hours as needed for Anxiety (muscle spasm)   . Fluoxetine 90 mg Oral Capsule, Delayed Release(E.C.) Take 1 Cap (90 mg total) by mouth Every 7 days   . levonorgestrel (MIRENA) 20 mcg/24 hr intrauterine device by Intrauterine route One time   . metroNIDAZOLE (METROGEL-VAGINAL) 0.75 % Vaginal Gel Use nightly vaginally for 10 days and then twice weekly for 6 months       Allergies: No Known Allergies    Medical issues:   Patient Active Problem List   Diagnosis   . IUD (intrauterine device) in place   . Morbidly obese (CMS HCC)   . Morbid obesity with BMI of 40.0-44.9, adult (CMS HCC)   . Type 2 diabetes mellitus (CMS HCC)   . 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  Moved here from [place] for Pearlie Oyster and [employer] 2017  It is more expensive here but her family likes it  Husband - is a Counsellor and 3 girls - ages 35, 22.5 and 26 yrs and doing well  Family history    Family Medical History:     Problem Relation (Age of Onset)    Celiac Disease Mother    Diabetes Mother, Father        Weight history    HS age  53lbs in [place]  Marriage 180 lbs  Soon to 200lb again  2 miscarriages and up to 33  Then 34 with first kid;  Gained no wt with second 2 kids then after that  Staying around 260 lbs  Then several yrs ago lost to 200lbs by taking meds of phenteramine and another drug prescribed by a bariatric clinic  Was coaching and moving and active and was in college for herself and kept weight off  Past several yrs- stress of her work, raising 3 kids, and finishing college and prone to depression- weight  has come back on  Works day shift- long hours- running after kids  Hubby - 180lbs - 5'5 - stocky and the girls   oldest daughter -140lbs- 5'2- thick boned and flat belly ( weight in her butt)- soccer player - size 7 foot  Middle daughter 150 lbs 5'2 and also big girl- chunkiness size 10 shoes  Youngest daughter - 58 yr old- 49 lbs -     Diet recall:   Breakfast at work- plate of eggs and bacon and potatoes and soda  Lunch chicken sandwich and fired and ate the fries and soda  Dinner hamburger meat and carrots and onions and hispanic and beans and corn tortilla and milk  Snacks- not usually  Meat and potatoes and does love salty snacks- cheeze its and Dr. Malachi Bonds  Foods she does not or cannot eat:  Citrus- can't tolerate it  Liquids daily: soda milk water  Mother was picky eater and so was she- mac and cheese and hot dogs  Doesn't like to try new things/foods    Hubby usually cooks at the house  And kids cook  Physical activity:  None- usual desk job  Sleep:  Good sleeper- bed at 9pm and is out right away  Morning feels good    Stressors:  Treated for depression    ROS: Does not feel well- lower back.   No dyspnea or chest pain on exertion.   No abdominal pain, change in bowel habits, black or bloody stools.   No urinary tract symptoms.   M/S: back pain lower - past week - went to ER- gpt muscle relaxer etc    OBJECTIVE:   BP 110/68   Pulse 98   Ht 1.753 m (_0 )   Wt 133.9 kg (295 lb 3.2 oz)   BMI 43.59 kg/m   The patient appears well, in NAD.    Eyes: Pupils round and equal   Neck: supple,   Lungs: No resp ditress   Normal gait   Skin warm and dry   Alert and oriented   Mood: normal   Legs- no swelling    ASSESSMENT:   Morbid obesity with secondary issues of new onset diabetes  a1c was 6.5    PLAN    Today we talked about a Modified Mediterranean diet as a good place to start  We talked first about the insulin response to food- both for a person without insulin resistance and then for a patient with insulin  resistance  I explained how by keeping away from sugar and starches, we can blunt this response and hoepfully lose some weight    We then went over the food list  Green foods are  High quality protein, healthy fats and a lot of green leafy vegetables and above the ground vegetables ad full fat diary  And I gave her a menu, some recipes and the food list  Recheck 3 weeks  She will start by eating veggies - at least twice a day  getting kids involved in cooking a meal once a week  Finis Bud, MD

## 2018-01-15 ENCOUNTER — Encounter (INDEPENDENT_AMBULATORY_CARE_PROVIDER_SITE_OTHER): Payer: Self-pay | Admitting: Family Medicine

## 2018-01-31 ENCOUNTER — Encounter (INDEPENDENT_AMBULATORY_CARE_PROVIDER_SITE_OTHER): Payer: Self-pay | Admitting: OBSTETRICS/GYNECOLOGY

## 2018-01-31 ENCOUNTER — Ambulatory Visit (INDEPENDENT_AMBULATORY_CARE_PROVIDER_SITE_OTHER): Payer: 59 | Admitting: OBSTETRICS/GYNECOLOGY

## 2018-01-31 VITALS — BP 94/60 | HR 74 | Ht 69.0 in | Wt 293.0 lb

## 2018-01-31 DIAGNOSIS — Z3202 Encounter for pregnancy test, result negative: Secondary | ICD-10-CM

## 2018-01-31 DIAGNOSIS — Z30433 Encounter for removal and reinsertion of intrauterine contraceptive device: Principal | ICD-10-CM

## 2018-01-31 NOTE — Procedures (Signed)
Name of Patient: Angelica Garcia  DOB: Jun 12, 1981  MRN: [id number]    Date of Service: 01/31/2018        The patient was placed in stirrups and a speculum was used to visualize the cervix. IUD strings were visualized at the external os and grasped with ring forceps and the IUD was removed without difficulty.

## 2018-01-31 NOTE — Patient Instructions (Signed)
PATIENT DISCHARGE INSTRUCTIONS FOR IUD INSERTION    The IUD is effective as soon as it is inserted.  It is normal to have some cramp-like pain in your abdomen for a few days after insertion.  You may have some light bleeding during the next month.  Your periods may be irregular, heavier, painful or longer than usual.    Rest for 24 hours after insertion of your IUD.  Vigorous exercise may aggravate cramps in the first 24 hours.  Increase oral fluids.  Ibuprofen 600 mg orally every 6 hours as needed for abdominal cramping (remember to take with food).    No sexual intercourse for 10 days after insertion.  Use a condom with sexual intercourse until your follow up visit.  Use sanitary pads, NOT tampons with this period.    CHECKING YOUR IUD  Wash your hands with soap and water.  Stand with one foot on the edge of a chair, bed, bathtub, or squat or lie on your back.  Gently insert your finger(s) into your vagina.  Feel for the cervix.  Feel for the strings, but do not pull the strings.  Check for the strings after each menstrual cycle or monthly.    NOTIFY YOUR PROVIDER if:  You do not feel the strings.  The strings feel longer than they used to.  You feel the hard part of the IUD in your vagina or at your cervix.  Unusual or smelly vaginal discharge.  An unusually heavy or painful period.  Fever or chills.  Pain during intercourse.    Your next appointment should be after your next period, or in about 6 weeks.

## 2018-01-31 NOTE — Procedures (Signed)
[facility]  [address]  [place]  [id number]  [phone]      Name of Patient: Angelica Garcia  DOB: 11-Jul-1981  MRN: [id number]    Date of Service: 01/31/2018        Mirena in place and removed. No HCG done   No LMP recorded. Patient is not currently having periods (Reason: Progesterone IUD).      Mirena IUD Insertion    Procedure:    Informed consent obtained.  All risks, benefits and alternative methods of contracetion were reviewed with the patient.   All questions were answered.   A speculum was placed in the vagina in order to visualize the cervix, the cervix was then prepped with Hibiclens solution. A tenaculum was then used to grasp the anterior lip of the cervix to provide traction during the procedure. The uterus sounded to 7.5 cm.  Mirena IUD was then inserted to the fundus without complications using a no touch technique. The IUD strings were then cut approximately 3-4cm from the external cervical os. All instruments removed from the patient's vagina.      U/S done to confirm position.      Patient given post-procedure instructions.  Patient counseled to perform string check each month after menses.  Advised immediate follow-up if she develops fever > 101F, bleeding greater then 1 pad/hour, or excessive cramping, nausea, or discharge.    The patient verbalizes a reasonable understanding of this procedure and these instructions and is in agreement with the plan of care.

## 2018-02-04 NOTE — Nursing Note (Signed)
02/04/18 1100   Medication Administration   Initials ARR   Other Medication MIRENA   Medication Dose 52 MG   Route of Administration   [employer](VAGINA; )   Site   (UTERUS)   NDC # [id number]   LOT # [id number]   Expiration date 05/25/20   Manufacturer BAYER   Clinic Supplied Yes   Patient Supplied No   Comments: UPT NEG -CONSENT SIGNED

## 2018-02-04 NOTE — Nursing Note (Signed)
02/04/18 1100   Urine   Urine HCG Negative   Initials ARR   RAPID MONO   Initials ARR

## 2018-03-06 ENCOUNTER — Ambulatory Visit (INDEPENDENT_AMBULATORY_CARE_PROVIDER_SITE_OTHER): Payer: 59 | Admitting: Family

## 2018-03-06 VITALS — BP 100/62 | HR 97 | Temp 98.2°F | Resp 16 | Ht 69.0 in | Wt 296.0 lb

## 2018-03-06 DIAGNOSIS — F341 Dysthymic disorder: Secondary | ICD-10-CM

## 2018-03-06 MED ORDER — FLUOXETINE 90 MG CAPSULE,DELAYED RELEASE
90.00 mg | DELAYED_RELEASE_CAPSULE | ORAL | 5 refills | Status: DC
Start: 2018-03-06 — End: 2018-09-08

## 2018-03-06 NOTE — Nursing Note (Signed)
Chief Complaint:   Chief Complaint            Medication Check         Functional Health Screen        Vital Signs  BP 100/62   Pulse 97   Temp 36.8 C (98.2 F)   Resp 16   Ht 1.753 m (5\' 9" )   Wt 134.3 kg (296 lb)   SpO2 98%   BMI 43.71 kg/m       Social History     Tobacco Use   Smoking Status Never Smoker   Smokeless Tobacco Never Used     Allergies  No Known Allergies  Medication History  Reviewed for OTC medication and any new medications, provider will review medication history  Care Team  Patient Care Team:  Pearlie Oysterhindsa, Mandeep, APRN as PCP - General (NURSE PRACTITIONER)  Immunizations - last 24 hours     None        Philis PiqueLynn Roberta Kelly, CMA  03/06/2018, 15:24

## 2018-03-06 NOTE — Progress Notes (Signed)
[facility]  FAMILY [facility]IC[facility], [facility] [place]  [address]  [place] [id number]  Dept: [phone]  Dept Fax: [phone]  Loc: [phone]  Loc Fax: [phone]    Patient name: Angelica Garcia  Age: 37 y.o. 12/19/1980  DOS: 03/06/2018  PCP: Pearlie OysterMandeep Rahman Ferrall, APRN  Visit Provider: Pearlie OysterMandeep Latalia Etzler, APRN, NP-C    Chief Complaint: Angelica Garcia is a 37 y.o. female who presents for   Chief Complaint   Patient presents with   . Medication Check      HPI/Subjective:  Patient is here for follow up on depression.  Patient is taking weekly Prozac with good relief and without any side effect.  Denies any HI/SI, need refill today.    Health Maintenance: Pending and Last Completed       Date Due Completion Date    HIV Screening 11/10/1994 ---    Adult Tdap-Td (1 - Tdap) 11/10/1998 ---    Pneumococcal 19-64 Years Medium Risk (1 of 1 - PPSV23) 11/10/1998 ---    Influenza Vaccine (Season Ended) 07/27/2018 01/18/2017    Pap smear 02/12/2020 02/11/2017        PHQ Questionnaire       Review of Systems:   Constitutional: no recent weight loss, no fever, no chills.  Cardiovascular: no chest pain, no chest pressure/discomfort, no palpitations, no near-syncope or syncope  Respiratory: no cough, no wheezing, no SOB.   GI: no abdominal pain, no nausea, no vomiting, no constipation, no diarrhea, no bloody stools  Emotional/Pscychiatric: no anxiety, no depression, no insomnia      No Known Allergies  Social History     Tobacco Use   . Smoking status: Never Smoker   . Smokeless tobacco: Never Used   Substance Use Topics   . Alcohol use: Yes     Comment: rare    . Drug use: No     Social History     Substance and Sexual Activity   Drug Use No         Physical Exam:  VS (completed by my clinic staff and I agree with findings): BP 100/62   Pulse 97   Temp 36.8 C (98.2 F)   Resp 16   Ht 1.753 m (5\' 9" )   Wt 134.3 kg (296 lb)   SpO2 98%   BMI 43.71 kg/m      Physical Exam:  General: Patient is 37 y.o., female, well  dressed, in no acute distress.   Heart: RRR. No murmur. LE: no edema  Lungs: clear to auscultation. Unlabored breathing  Mental Status: alert, oriented to person, place, and time. Normal mood, and behavior.       Assessment and Plan:    ICD-10-CM    1. Dysthymia F34.1 Fluoxetine 90 mg Oral Capsule, Delayed Release(E.C.)     [name] was seen today for medication check.    Diagnoses and all orders for this visit:    Dysthymia  Controlled with current medicine. Refill given as below.    -     Fluoxetine 90 mg Oral Capsule, Delayed Release(E.C.); Take 1 Cap (90 mg total) by mouth Every 7 days    Follow up: Return in about 6 months (around 09/05/2018) for Follow Up, Medicine refill.    Outpatient Medications Marked as Taking for the 03/06/18 encounter (Office Visit) with Pearlie Oysterhindsa, Torrion Witter, APRN   Medication Sig   . Fluoxetine 90 mg Oral Capsule, Delayed Release(E.C.) Take 1 Cap (90 mg total) by mouth Every 7 days   .  levonorgestrel (MIRENA) 20 mcg/24 hr intrauterine device by Intrauterine route One time       LABS:     Comprehensive Metabolic Profile    Lab Results   Component Value Date/Time    SODIUM 135 (L) 11/21/2017 08:54 AM    POTASSIUM 4.5 11/21/2017 08:54 AM    CHLORIDE 101 11/21/2017 08:54 AM    CO2 26 11/21/2017 08:54 AM    ANIONGAP 8 11/21/2017 08:54 AM    BUN 11 11/21/2017 08:54 AM    CREATININE 0.74 11/21/2017 08:54 AM    Lab Results   Component Value Date/Time    CALCIUM 9.5 11/21/2017 08:54 AM    ALBUMIN 3.8 11/21/2017 08:54 AM    TOTALPROTEIN 7.0 11/21/2017 08:54 AM    ALKPHOS 57 11/21/2017 08:54 AM    AST 21 11/21/2017 08:54 AM    ALT 26 11/21/2017 08:54 AM    TOTBILIRUBIN 1.1 11/21/2017 08:54 AM                     CBC  Diff   Lab Results   Component Value Date/Time    WBC 9.2 11/21/2017 08:54 AM    HGB 15.2 11/21/2017 08:54 AM    HCT 45.2 (H) 11/21/2017 08:54 AM    PLTCNT 370 11/21/2017 08:54 AM    RBC 5.18 (H) 11/21/2017 08:54 AM    MCV 87.3 11/21/2017 08:54 AM    MCHC 33.6 11/21/2017 08:54 AM    MCH  29.3 11/21/2017 08:54 AM    RDW 13.5 11/21/2017 08:54 AM    MPV 8.3 11/21/2017 08:54 AM    Lab Results   Component Value Date/Time    PMNS 73 11/21/2017 08:54 AM    LYMPHOCYTES 19 11/21/2017 08:54 AM    EOSINOPHIL 2 11/21/2017 08:54 AM    MONOCYTES 5 11/21/2017 08:54 AM    BASOPHILS 1 11/21/2017 08:54 AM    BASOPHILS 0.00 11/21/2017 08:54 AM    PMNABS 6.70 (H) 11/21/2017 08:54 AM    LYMPHSABS 1.80 11/21/2017 08:54 AM    EOSABS 0.20 11/21/2017 08:54 AM    MONOSABS 0.50 11/21/2017 08:54 AM            Lab Results   Component Value Date    TSH 2.994 11/21/2017           Lab Results   Component Value Date    CHOLESTEROL 149 01/18/2017    HDLCHOL 35 (L) 01/18/2017    LDLCHOL 99 01/18/2017    TRIG 75 01/18/2017        Collaborative/Supervising Physician: Dr. Mack Guise, APRN    Portions of this note may be dictated using voice recognition software . Variances in spelling and vocabulary are possible and unintentional. Not all errors are caught/corrected. Please notify the Thereasa Parkin if any discrepancies are noted or if the meaning of any statement is not clear.

## 2018-03-07 ENCOUNTER — Encounter (INDEPENDENT_AMBULATORY_CARE_PROVIDER_SITE_OTHER): Payer: Self-pay | Admitting: Family

## 2018-03-17 ENCOUNTER — Encounter (INDEPENDENT_AMBULATORY_CARE_PROVIDER_SITE_OTHER): Payer: Self-pay | Admitting: OBSTETRICS/GYNECOLOGY

## 2018-03-17 ENCOUNTER — Ambulatory Visit (INDEPENDENT_AMBULATORY_CARE_PROVIDER_SITE_OTHER): Payer: 59 | Admitting: OBSTETRICS/GYNECOLOGY

## 2018-03-17 VITALS — BP 100/70 | HR 97 | Temp 98.4°F | Ht 69.0 in | Wt 296.4 lb

## 2018-03-17 DIAGNOSIS — Z30431 Encounter for routine checking of intrauterine contraceptive device: Secondary | ICD-10-CM

## 2018-03-17 NOTE — Progress Notes (Signed)
Department of Obstetrics and Gynecology         Name of Patient: Angelica Garcia  DOB: 1981-02-06  MRN: [id number]    Date of Service: 03/17/2018        Subjective:    37 y.o. [id number] here today for IUD follow up. Satisfied with her IUD. Does not have any bleeding or cramping.  Does have an extensive history of BV. Has been treated with flagyl and boric acid suppositories. Feels the flagyl suppositories work well. Uses them about every other week. Today she denies any excess discharge. No odor to discharge. No vaginal irritation or itching. No other complaints.    Chief Complaint   Patient presents with   . Iud String Check     No questions or concerns        REVIEW OF SYSTEMS :  Constitutional: Negative. Denies any fever, chills or fatigue   Skin: Negative.  Denies any rash or skin changes  HENT: Negative.  Denies any headaches, visual changes, swallowing issues  Cardiovascular: Negative.  Denies any chest pain or palpitations  Respiratory: Negative.  Denies any difficulty breathing or cough  Gastrointestinal: Negative.  Denies any change in bowel habits, nausea or vomiting  Genitourinary:        See HPI   Musculoskeletal: Negative.   Neurological: Negative.    Psychiatric: Negative.      Patient's PMH/PSH/FH/SH/Meds/Allergies were all reviewed and noted in the EPIC chart on today's visit.    Past Medical History:   Diagnosis Date   . Anxiety    . Depression    . IBS (irritable bowel syndrome)    . Low back pain     followed by ortho in [place] and [facility]   . Morbid obesity with BMI of 40.0-44.9, adult [facility]          Past Surgical History:   Procedure Laterality Date   . D&C FIRST TRIMESTER / TX INCOMPLETE / MISSED / SEPTIC / INDUCED ABORTION     . HX CHOLECYSTECTOMY     . HX ELECTIVE ABORTION     . HX UPPER ENDOSCOPY     . HX WISDOM TEETH EXTRACTION           Family Medical History:     Problem Relation (Age of Onset)    Celiac Disease Mother    Diabetes Mother, Father            Current Outpatient  Medications   Medication Sig Dispense Refill   . diazePAM (VALIUM) 5 mg Oral Tablet Take 1 Tab (5 mg total) by mouth Every 6 hours as needed for Anxiety (muscle spasm) 20 Tab 0   . Fluoxetine 90 mg Oral Capsule, Delayed Release(E.C.) Take 1 Cap (90 mg total) by mouth Every 7 days 4 Cap 5   . levonorgestrel (MIRENA) 20 mcg/24 hr intrauterine device by Intrauterine route One time     . metroNIDAZOLE (METROGEL-VAGINAL) 0.75 % Vaginal Gel Use nightly vaginally for 10 days and then twice weekly for 6 months 1 Tube 5     No current facility-administered medications for this visit.      No Known Allergies  Social History     Socioeconomic History   . Marital status: Married     Spouse name: Not on file   . Number of children: 3   . Years of education: Not on file   . Highest education level: Not on file   Occupational History   . Not on  file   Social Needs   . Financial resource strain: Not on file   . Food insecurity:     Worry: Not on file     Inability: Not on file   . Transportation needs:     Medical: Not on file     Non-medical: Not on file   Tobacco Use   . Smoking status: Never Smoker   . Smokeless tobacco: Never Used   Substance and Sexual Activity   . Alcohol use: Yes     Comment: rare    . Drug use: No   . Sexual activity: Yes     Partners: Male     Birth control/protection: IUD     Comment: IUD inserted in 12/2012   Lifestyle   . Physical activity:     Days per week: Not on file     Minutes per session: Not on file   . Stress: Not on file   Relationships   . Social connections:     Talks on phone: Not on file     Gets together: Not on file     Attends religious service: Not on file     Active member of club or organization: Not on file     Attends meetings of clubs or organizations: Not on file     Relationship status: Not on file   . Intimate partner violence:     Fear of current or ex partner: Not on file     Emotionally abused: Not on file     Physically abused: Not on file     Forced sexual activity: Not on  file   Other Topics Concern   . Not on file   Social History Narrative   . Not on file           Objective:  BP 100/70   Pulse 97   Temp 36.9 C (98.4 F) (Oral)   Ht 1.753 m (5\' 9" )   Wt 134.4 kg (296 lb 6.4 oz)   BMI 43.77 kg/m       Physical Exam:    Constitutional: She is oriented. She appears well-developed and well-nourished.     HENT: Head: Normocephalic.     Cardio: RRR, no murmur    Pulm: lungs CTAB, nonlabored breathing    Abdomen: She exhibits no distension. Soft. No tenderness. She has no rebound and no guarding.     Psychiatric: She has a normal mood and affect. Her behavior is normal.     Extremities:  No edema    Skin:  normal    GU:  External Exam: External exam normal.    No urethral tenderness.  No genital lesions present.    Vaginal Exam:  Vagina normal to exam.    Uterus: Uterus is normal to exam.  Cervix is normal to exam. IUD strings visible    Assessment & Plan:     ICD-10-CM    1. IUD check up Z30.431      IUD strings visualized    Pap smear up to date (neg/neg 3/18)    Return in 1 year for annual exam or sooner as needed.        Rachelle Hora, MD  PGY 1  [facility]  Obstetrics and Gynecology     I saw and examined the patient. I reviewed the resident's note. I agree with the findings and plan of care as documented in the resident's note. Any exceptions/additions are edited/noted. Irish Elders,  DO 03/17/2018, 14:45

## 2018-08-01 ENCOUNTER — Ambulatory Visit (INDEPENDENT_AMBULATORY_CARE_PROVIDER_SITE_OTHER): Payer: 59 | Admitting: Family

## 2018-08-01 VITALS — BP 100/68 | HR 68 | Temp 98.6°F | Resp 16 | Ht 69.0 in | Wt 277.0 lb

## 2018-08-01 DIAGNOSIS — G5601 Carpal tunnel syndrome, right upper limb: Principal | ICD-10-CM

## 2018-08-01 MED ORDER — PREDNISONE 10 MG TABLET: Tab | ORAL | 0 refills | 0 days | Status: DC

## 2018-08-01 NOTE — Nursing Note (Signed)
Chief Complaint:   Chief Complaint            Hand Pain right /  swelling x 2 month off and on / no known injury        Functional Health Screen        Vital Signs  BP 100/68   Pulse 68   Temp 37 C (98.6 F)   Resp 16   Ht 1.753 m (5\' 9" )   Wt 125.6 kg (277 lb)   SpO2 97%   BMI 40.91 kg/m       Social History     Tobacco Use   Smoking Status Never Smoker   Smokeless Tobacco Never Used     Allergies  No Known Allergies  Medication History  Reviewed for OTC medication and any new medications, provider will review medication history  Care Team  Patient Care Team:  Pearlie Oyster, APRN as PCP - General (NURSE PRACTITIONER)  Immunizations - last 24 hours     None        Philis Pique, CMA  08/01/2018, 13:53

## 2018-08-01 NOTE — Progress Notes (Signed)
[facility]  FAMILY MEDICINE, [facility]  [address]  [place] [address]  Dept: [phone]  Dept Fax: [phone]  Loc: [phone]  Loc Fax: [phone]    Patient name: Angelica Garcia  Age: 37 y.o. 1981/04/29  DOS: 08/01/2018  PCP: Pearlie Oyster, APRN  Visit Provider: Pearlie Oyster, APRN, NP-C    Chief Complaint: Angelica Garcia is a 37 y.o. female who presents for   Chief Complaint   Patient presents with   . Hand Pain     right /  swelling x 2 month off and on / no known injury      HPI/Subjective:  Patient is here for a sick visit.     Right wrist pain, going on for few months.  Last week pain got worse.  Hard to open jars now.    ++tingling/numbness to fingers.   Right handed, work typing at work.   Finishing challenge 6 weeks for weight loss, was doing weight lifting, which also exacerbate the pain.   No redness or swelling today. ++mild tender with ROM.     Health Maintenance: Pending and Last Completed       Date Due Completion Date    HIV Screening 11/10/1994 ---    Adult Tdap-Td (1 - Tdap) 11/10/1998 ---    Pneumococcal 19-64 Years Medium Risk (1 of 1 - PPSV23) 11/10/1998 ---    Influenza Vaccine (1) 07/27/2018 01/18/2017    Pap smear 02/12/2020 02/11/2017            Review of Systems:   Constitutional: no recent weight loss, no fever, no chills.  Cardiovascular: no chest pain, no chest pressure/discomfort, no palpitations, no near-syncope or syncope  Respiratory: no cough, no wheezing, no SOB.   GI: no abdominal pain, no nausea, no vomiting, no constipation, no diarrhea, no bloody stools  Emotional/Pscychiatric: no anxiety, no depression, no insomnia  Musculoskeletal: ++ joint pain, no joint swelling, no joint stiffness, no muscle pain        No Known Allergies  Social History     Tobacco Use   . Smoking status: Never Smoker   . Smokeless tobacco: Never Used   Substance Use Topics   . Alcohol use: Yes     Comment: rare    . Drug use: No     Social History     Substance and  Sexual Activity   Drug Use No         Physical Exam:  VS (completed by my clinic staff and I agree with findings): BP 100/68   Pulse 68   Temp 37 C (98.6 F)   Resp 16   Ht 1.753 m (5\' 9" )   Wt 125.6 kg (277 lb)   SpO2 97%   BMI 40.91 kg/m      Physical Exam:  General: Patient is 37 y.o., female, well dressed, in no acute distress.   Heart: RRR. No murmur. LE: no edema  Lungs: clear to auscultation. Unlabored breathing  Abdomen: non-tender, and non-distended. No masses.   Musculoskeletal/Neuro: Gait normal. No focal deficits noted. Right wrist: no redness or swelling. Phalen/tinel test neg.   Mental Status: alert, oriented to person, place, and time. Normal mood, and behavior.       Assessment and Plan:    ICD-10-CM    1. Carpal tunnel syndrome of right wrist G56.01 Wrist cock up splint     predniSONE (DELTASONE) 10 mg Oral Tablet     Angelica Garcia was seen today  for hand pain.    Diagnoses and all orders for this visit:    Carpal tunnel syndrome of right wrist  Mild carpal tunnel syndrome which get exacerbated with daily typing and doing weightlifting.  Will treat with prednisone, prescription given as below.  Also prescribed wrist splint to use at nighttime or during inactivity.  Return to the clinic if no relief or if symptoms get worse.  -     Wrist cock up splint; Future  -     predniSONE (DELTASONE) 10 mg Oral Tablet; Take 4 tabs by mouth for 2 days, then 3 tabs for 2 days, then 2 tabs by mouth for 2 days, then 1 tab by mouth for 2 days.          Follow up: Return if symptoms worsen or fail to improve.    Outpatient Medications Marked as Taking for the 08/01/18 encounter (Office Visit) with Pearlie Oyster, APRN   Medication Sig   . Fluoxetine 90 mg Oral Capsule, Delayed Release(E.C.) Take 1 Cap (90 mg total) by mouth Every 7 days   . levonorgestrel (MIRENA) 20 mcg/24 hr intrauterine device by Intrauterine route One time   . predniSONE (DELTASONE) 10 mg Oral Tablet Take 4 tabs by mouth for 2 days, then 3  tabs for 2 days, then 2 tabs by mouth for 2 days, then 1 tab by mouth for 2 days.       LABS:     Comprehensive Metabolic Profile    SODIUM   Date Value Ref Range Status   11/21/2017 135 (L) 136 - 145 mmol/L Final     POTASSIUM   Date Value Ref Range Status   11/21/2017 4.5 3.4 - 5.1 mmol/L Final     CHLORIDE   Date Value Ref Range Status   11/21/2017 101 101 - 111 mmol/L Final     CO2 TOTAL   Date Value Ref Range Status   11/21/2017 26 22 - 32 mmol/L Final     ANION GAP   Date Value Ref Range Status   11/21/2017 8 3 - 11 mmol/L Final     BUN   Date Value Ref Range Status   11/21/2017 11 6 - 20 mg/dL Final     CREATININE   Date Value Ref Range Status   11/21/2017 0.74 0.44 - 1.00 mg/dL Final    CALCIUM   Date Value Ref Range Status   11/21/2017 9.5 8.6 - 10.3 mg/dL Final     ALBUMIN   Date Value Ref Range Status   11/21/2017 3.8 3.5 - 5.0 g/dL Final     PROTEIN TOTAL   Date Value Ref Range Status   11/21/2017 7.0 6.4 - 8.3 g/dL Final     ALKALINE PHOSPHATASE   Date Value Ref Range Status   11/21/2017 57 38 - 126 U/L Final     AST (SGOT)   Date Value Ref Range Status   11/21/2017 21 15 - 41 U/L Final     ALT (SGPT)   Date Value Ref Range Status   11/21/2017 26 14 - 54 U/L Final     BILIRUBIN TOTAL   Date Value Ref Range Status   11/21/2017 1.1 0.3 - 1.2 mg/dL Final                     CBC  Diff   Lab Results   Component Value Date/Time    WBC 9.2 11/21/2017 08:54 AM    HGB 15.2 11/21/2017  08:54 AM    HCT 45.2 (H) 11/21/2017 08:54 AM    PLTCNT 370 11/21/2017 08:54 AM    RBC 5.18 (H) 11/21/2017 08:54 AM    MCV 87.3 11/21/2017 08:54 AM    MCHC 33.6 11/21/2017 08:54 AM    MCH 29.3 11/21/2017 08:54 AM    RDW 13.5 11/21/2017 08:54 AM    MPV 8.3 11/21/2017 08:54 AM    Lab Results   Component Value Date/Time    PMNS 73 11/21/2017 08:54 AM    LYMPHOCYTES 19 11/21/2017 08:54 AM    EOSINOPHIL 2 11/21/2017 08:54 AM    MONOCYTES 5 11/21/2017 08:54 AM    BASOPHILS 1 11/21/2017 08:54 AM    BASOPHILS 0.00 11/21/2017 08:54 AM     PMNABS 6.70 (H) 11/21/2017 08:54 AM    LYMPHSABS 1.80 11/21/2017 08:54 AM    EOSABS 0.20 11/21/2017 08:54 AM    MONOSABS 0.50 11/21/2017 08:54 AM            Lab Results   Component Value Date    TSH 2.994 11/21/2017           Lab Results   Component Value Date    CHOLESTEROL 149 01/18/2017    HDLCHOL 35 (L) 01/18/2017    LDLCHOL 99 01/18/2017    TRIG 75 01/18/2017        Collaborative/Supervising Physician: Dr. Mack Guise, APRN    Portions of this note may be dictated using voice recognition software . Variances in spelling and vocabulary are possible and unintentional. Not all errors are caught/corrected. Please notify the [facility] Park[facility] if any discrepancies are noted or if the meaning of any statement is not clear.

## 2018-08-05 ENCOUNTER — Encounter (INDEPENDENT_AMBULATORY_CARE_PROVIDER_SITE_OTHER): Payer: Self-pay | Admitting: Family

## 2018-08-07 ENCOUNTER — Telehealth (INDEPENDENT_AMBULATORY_CARE_PROVIDER_SITE_OTHER): Payer: Self-pay | Admitting: Family

## 2018-08-07 NOTE — Telephone Encounter (Signed)
Please review and sign the [employer] forms in your box. I will take care of when completed.       Fontaine Noiffany Coates   Registration Specialist

## 2018-08-08 NOTE — Telephone Encounter (Signed)
Completed and signed.   Porscha Axley, APRN  08/08/2018, 13:12

## 2018-09-08 ENCOUNTER — Encounter (INDEPENDENT_AMBULATORY_CARE_PROVIDER_SITE_OTHER): Payer: Self-pay | Admitting: Family

## 2018-09-08 ENCOUNTER — Ambulatory Visit (INDEPENDENT_AMBULATORY_CARE_PROVIDER_SITE_OTHER): Payer: 59 | Admitting: Family

## 2018-09-08 VITALS — BP 110/68 | HR 82 | Temp 97.0°F | Resp 16 | Ht 68.0 in | Wt 279.0 lb

## 2018-09-08 DIAGNOSIS — M545 Low back pain, unspecified: Secondary | ICD-10-CM

## 2018-09-08 DIAGNOSIS — F341 Dysthymic disorder: Secondary | ICD-10-CM

## 2018-09-08 DIAGNOSIS — G8929 Other chronic pain: Secondary | ICD-10-CM

## 2018-09-08 DIAGNOSIS — E119 Type 2 diabetes mellitus without complications: Secondary | ICD-10-CM

## 2018-09-08 MED ORDER — FLUOXETINE 90 MG CAPSULE,DELAYED RELEASE: 90 mg | Cap | ORAL | 5 refills | 0 days | Status: AC

## 2018-09-08 MED ORDER — METHOCARBAMOL 750 MG TABLET: 750 mg | Tab | Freq: Three times a day (TID) | ORAL | 0 refills | 0 days | Status: AC | PRN

## 2018-09-08 MED ORDER — LIDOCAINE 5 % TOPICAL PATCH
1.0000 | MEDICATED_PATCH | Freq: Every day | CUTANEOUS | 0 refills | Status: AC
Start: 2018-09-08 — End: ?

## 2018-09-08 NOTE — Nursing Note (Signed)
Chief Complaint:   Chief Complaint            Medication Check         Functional Health Screen        Vital Signs  BP 110/68   Pulse 82   Temp 36.1 C (97 F)   Resp 16   Ht 1.727 m (5\' 8" )   Wt 126.6 kg (279 lb)   SpO2 98%   BMI 42.42 kg/m       Social History     Tobacco Use   Smoking Status Never Smoker   Smokeless Tobacco Never Used     Allergies  No Known Allergies  Medication History  Reviewed for OTC medication and any new medications, provider will review medication history  Care Team  Patient Care Team:  Pearlie Oyster, APRN as PCP - General (NURSE PRACTITIONER)  Immunizations - last 24 hours     None        Philis Pique, CMA  09/08/2018, 15:15

## 2018-09-08 NOTE — Progress Notes (Signed)
[facility]  FAMILY MEDICINE, [facility]  [address]  [place] [address]  Dept: [phone]  Dept Fax: [phone]  Loc: [phone]  Loc Fax: [phone]    Patient name: Angelica Garcia  Age: 37 y.o. 05/17/1981  DOS: 09/08/2018  PCP: Pearlie Oyster, APRN  Visit Provider: Pearlie Oyster, APRN, NP-C    Chief Complaint: Angelica Garcia is a 37 y.o. female who presents for   Chief Complaint   Patient presents with   . Medication Check      HPI/Subjective:  Patient is here for follow up.    Depression: stable with prozac. No concern today. Need refill. No hi/si.     DM: new dx. Not on any diabetic medicine at this time. Last A1C 6.5% on 12/05/17, said working on healthy life style. ++obese,  Body mass index is 42.42 kg/m.    Diabetes Monitors  A1C: 6.5  A1C Date: 12/05/2017          Urine Microalbumin: Not Found   Microalbumin Date: Not Found    Last Lipid Panel  (Last result in the past 2 years)      Cholesterol   HDL   LDL   Direct LDL   Triglycerides      01/18/17 0930 149 35 99   75        Retinal Exam Date: Not Found  Last diabetic foot exam: Not Found    3: reports ch low back pain with muscle spasm. Said ER Rx valium in the past and she takes it as needed. Asking for a refill because she is going on a vacation in dec, 2019. Told pt, I will Rx robaxin and lidocaine patch for her to take as needed. I do not like to Rx valium for low back pain. Pt is agree and verbalized understanding.     Health Maintenance: Pending and Last Completed       Date Due Completion Date    HIV Screening 11/10/1994 ---    Adult Tdap-Td (1 - Tdap) 11/10/1998 ---    Pneumococcal 19-64 Years Medium Risk (1 of 1 - PPSV23) 11/10/1998 ---    Influenza Vaccine (1) 07/27/2018 01/18/2017    Pap smear 02/12/2020 02/11/2017        Review of Systems:   Constitutional: no recent weight loss, no fever, no chills.  Cardiovascular: no chest pain, no chest pressure/discomfort, no palpitations, no near-syncope or  syncope  Respiratory: no cough, no wheezing, no SOB.   GI: no abdominal pain, no nausea, no vomiting, no constipation, no diarrhea, no bloody stools  Emotional/Pscychiatric: no anxiety, no depression, no insomnia  Musculoskeletal: ++ joint pain, no joint swelling, no joint stiffness, ++ muscle spasm.       No Known Allergies  Social History     Tobacco Use   . Smoking status: Never Smoker   . Smokeless tobacco: Never Used   Substance Use Topics   . Alcohol use: Yes     Comment: rare    . Drug use: No     Social History     Substance and Sexual Activity   Drug Use No         Physical Exam:  VS (completed by my clinic staff and I agree with findings): BP 110/68   Pulse 82   Temp 36.1 C (97 F)   Resp 16   Ht 1.727 m (5\' 8" )   Wt 126.6 kg (279 lb)   SpO2 98%   BMI  42.42 kg/m      Physical Exam:  General: Patient is 37 y.o., female, well dressed, in no acute distress. ++obese.   Heart: RRR. No murmur. LE: no edema  Lungs: clear to auscultation. Unlabored breathing  Abdomen: non-tender, and non-distended. No masses.   Musculoskeletal/Neuro: Gait normal. No focal deficits noted.  Mental Status: alert, oriented to person, place, and time. Normal mood, and behavior.       Assessment and Plan:    ICD-10-CM    1. Dysthymia F34.1 Fluoxetine 90 mg Oral Capsule, Delayed Release(E.C.)   2. Type 2 diabetes mellitus without complication, without long-term current use of insulin (CMS HCC) E11.9 HGA1C (HEMOGLOBIN A1C WITH EST AVG GLUCOSE)     COMPREHENSIVE METABOLIC PROFILE - [facility] ONLY     CBC W/AUTO DIFF - [facility] ONLY     THYROID STIMULATING HORMONE WITH FREE T4 REFLEX     LIPID PANEL     MICROALBUMIN/CREATININE RATIO, URINE, RANDOM   3. Chronic midline low back pain without sciatica M54.5 methocarbamol (ROBAXIN) 750 mg Oral Tablet    G89.29 lidocaine (LIDODERM) 5 % Adhesive Patch, Medicated     [name] was seen today for medication check.    Diagnoses and all orders for this visit:    Dysthymia  Controlled with current  medicine. Refill given as below. Potential SE Discussed.  -     Fluoxetine 90 mg Oral Capsule, Delayed Release(E.C.); Take 1 Cap (90 mg total) by mouth Every 7 days    Type 2 diabetes mellitus without complication, without long-term current use of insulin (CMS HCC)  New dx, continue with healthy life style. Will update labs as below, pt will come back for fasting labs.   -     HGA1C (HEMOGLOBIN A1C WITH EST AVG GLUCOSE); Future  -     COMPREHENSIVE METABOLIC PROFILE - [facility] ONLY; Future  -     CBC W/AUTO DIFF - [facility] ONLY; Future  -     THYROID STIMULATING HORMONE WITH FREE T4 REFLEX; Future  -     LIPID PANEL; Future  -     MICROALBUMIN/CREATININE RATIO, URINE, RANDOM; Future    Chronic midline low back pain without sciatica  Mildly uncontrolled, Rx robaxin and lidocaine as below. F/u as needed.   -     methocarbamol (ROBAXIN) 750 mg Oral Tablet; Take 1 Tab (750 mg total) by mouth Three times a day as needed for Other (muscle spasm)  -     lidocaine (LIDODERM) 5 % Adhesive Patch, Medicated; 1 Patch (700 mg total) by Transdermal route Once a day      Follow up: Return in about 6 months (around 03/10/2019) for Follow Up.    No outpatient medications have been marked as taking for the 09/08/18 encounter (Office Visit) with Pearlie Oyster, APRN.       LABS:     Comprehensive Metabolic Profile    SODIUM   Date Value Ref Range Status   11/21/2017 135 (L) 136 - 145 mmol/L Final     POTASSIUM   Date Value Ref Range Status   11/21/2017 4.5 3.4 - 5.1 mmol/L Final     CHLORIDE   Date Value Ref Range Status   11/21/2017 101 101 - 111 mmol/L Final     CO2 TOTAL   Date Value Ref Range Status   11/21/2017 26 22 - 32 mmol/L Final     ANION GAP   Date Value Ref Range Status   11/21/2017 8 3 -  11 mmol/L Final     BUN   Date Value Ref Range Status   11/21/2017 11 6 - 20 mg/dL Final     CREATININE   Date Value Ref Range Status   11/21/2017 0.74 0.44 - 1.00 mg/dL Final    CALCIUM   Date Value Ref Range Status   11/21/2017 9.5 8.6 -  10.3 mg/dL Final     ALBUMIN   Date Value Ref Range Status   11/21/2017 3.8 3.5 - 5.0 g/dL Final     PROTEIN TOTAL   Date Value Ref Range Status   11/21/2017 7.0 6.4 - 8.3 g/dL Final     ALKALINE PHOSPHATASE   Date Value Ref Range Status   11/21/2017 57 38 - 126 U/L Final     AST (SGOT)   Date Value Ref Range Status   11/21/2017 21 15 - 41 U/L Final     ALT (SGPT)   Date Value Ref Range Status   11/21/2017 26 14 - 54 U/L Final     BILIRUBIN TOTAL   Date Value Ref Range Status   11/21/2017 1.1 0.3 - 1.2 mg/dL Final                     CBC  Diff   Lab Results   Component Value Date/Time    WBC 9.2 11/21/2017 08:54 AM    HGB 15.2 11/21/2017 08:54 AM    HCT 45.2 (H) 11/21/2017 08:54 AM    PLTCNT 370 11/21/2017 08:54 AM    RBC 5.18 (H) 11/21/2017 08:54 AM    MCV 87.3 11/21/2017 08:54 AM    MCHC 33.6 11/21/2017 08:54 AM    MCH 29.3 11/21/2017 08:54 AM    RDW 13.5 11/21/2017 08:54 AM    MPV 8.3 11/21/2017 08:54 AM    Lab Results   Component Value Date/Time    PMNS 73 11/21/2017 08:54 AM    LYMPHOCYTES 19 11/21/2017 08:54 AM    EOSINOPHIL 2 11/21/2017 08:54 AM    MONOCYTES 5 11/21/2017 08:54 AM    BASOPHILS 1 11/21/2017 08:54 AM    BASOPHILS 0.00 11/21/2017 08:54 AM    PMNABS 6.70 (H) 11/21/2017 08:54 AM    LYMPHSABS 1.80 11/21/2017 08:54 AM    EOSABS 0.20 11/21/2017 08:54 AM    MONOSABS 0.50 11/21/2017 08:54 AM            Lab Results   Component Value Date    TSH 2.994 11/21/2017           Lab Results   Component Value Date    CHOLESTEROL 149 01/18/2017    HDLCHOL 35 (L) 01/18/2017    LDLCHOL 99 01/18/2017    TRIG 75 01/18/2017        Collaborative/Supervising Physician: Dr. Brandt Loosen Fulton Merry, APRN    Portions of this note may be dictated using voice recognition software . Variances in spelling and vocabulary are possible and unintentional. Not all errors are caught/corrected. Please notify the [facility] Park[facility] if any discrepancies are noted or if the meaning of any statement is not clear.

## 2018-09-09 ENCOUNTER — Encounter (INDEPENDENT_AMBULATORY_CARE_PROVIDER_SITE_OTHER): Payer: Self-pay | Admitting: Family

## 2018-09-11 ENCOUNTER — Ambulatory Visit (INDEPENDENT_AMBULATORY_CARE_PROVIDER_SITE_OTHER): Payer: 59 | Attending: Family

## 2018-09-11 DIAGNOSIS — E119 Type 2 diabetes mellitus without complications: Secondary | ICD-10-CM | POA: Insufficient documentation

## 2018-09-11 LAB — COMPREHENSIVE METABOLIC PROFILE - BMC/JMC ONLY
ALBUMIN/GLOBULIN RATIO: 1.2 (ref 0.8–2.0)
ALBUMIN: 3.8 g/dL (ref 3.5–5.0)
ALKALINE PHOSPHATASE: 55 U/L (ref 38–126)
ALT (SGPT): 21 U/L (ref 14–54)
ANION GAP: 6 mmol/L (ref 3–11)
AST (SGOT): 18 U/L (ref 15–41)
BILIRUBIN TOTAL: 0.8 mg/dL (ref 0.3–1.2)
BILIRUBIN TOTAL: 0.8 mg/dL (ref 0.3–1.2)
BUN/CREA RATIO: 21 (ref 6–22)
BUN: 14 mg/dL (ref 6–20)
CALCIUM: 9.5 mg/dL (ref 8.6–10.3)
CHLORIDE: 107 mmol/L (ref 101–111)
CHLORIDE: 107 mmol/L (ref 101–111)
CO2 TOTAL: 28 mmol/L (ref 22–32)
CREATININE: 0.68 mg/dL (ref 0.44–1.00)
ESTIMATED GFR: >60 mL/min/1.73mˆ2 (ref >60)
GLUCOSE: 143 mg/dL — ABNORMAL HIGH (ref 70–110)
POTASSIUM: 4.9 mmol/L (ref 3.4–5.1)
PROTEIN TOTAL: 6.9 g/dL (ref 6.4–8.3)
SODIUM: 141 mmol/L (ref 136–145)

## 2018-09-11 LAB — CBC W/AUTO DIFF
BASOPHIL #: 0.1 10*3/uL (ref 0.00–0.10)
BASOPHIL %: 1 % (ref 0–3)
EOSINOPHIL #: 0.2 10*3/uL (ref 0.00–0.50)
EOSINOPHIL %: 3 % (ref 0–5)
HCT: 45.8 % — ABNORMAL HIGH (ref 36.0–45.0)
HGB: 15.4 g/dL (ref 12.0–15.5)
LYMPHOCYTE #: 1.6 10*3/uL (ref 1.00–4.80)
LYMPHOCYTE %: 17 % (ref 15–43)
MCH: 30.4 pg (ref 27.5–33.2)
MCHC: 33.7 g/dL (ref 32.0–36.0)
MCV: 90.1 fL (ref 82.0–97.0)
MONOCYTE #: 0.6 x10ˆ3/uL (ref 0.20–0.90)
MONOCYTE %: 6 % (ref 5–12)
MPV: 8.7 fL (ref 7.4–10.5)
NEUTROPHIL #: 6.7 x10ˆ3/uL — ABNORMAL HIGH (ref 1.50–6.50)
NEUTROPHIL %: 73 % (ref 43–76)
PLATELETS: 346 x10ˆ3/uL (ref 150–450)
RBC: 5.08 x10ˆ6/uL (ref 4.00–5.10)
RDW: 14 % (ref 11.0–16.0)
WBC: 9.1 10*3/uL (ref 4.0–11.0)

## 2018-09-11 LAB — HGA1C (HEMOGLOBIN A1C WITH EST AVG GLUCOSE)
ESTIMATED AVERAGE GLUCOSE: 128 mg/dL
HEMOGLOBIN A1C: 6.1 % — ABNORMAL HIGH (ref 4.0–5.6)
HEMOGLOBIN A1C: 6.1 % — ABNORMAL HIGH (ref 4.0–5.6)

## 2018-09-11 LAB — MICROALBUMIN/CREATININE RATIO, URINE, RANDOM
CREATININE RANDOM URINE: 231 mg/dL
MICROALBUMIN RANDOM URINE: 0.9 mg/dL (ref 0.2–2.0)
MICROALBUMIN/CREATININE RATIO RANDOM URINE: 3.9 mg/g (ref 0.0–30.0)

## 2018-09-11 LAB — LIPID PANEL
CHOL/HDL RATIO: 4.7
CHOLESTEROL: 169 mg/dL (ref 120–199)
HDL CHOL: 36 mg/dL — ABNORMAL LOW (ref >39)
LDL CALC: 120 mg/dL (ref <=130)
LDL CALC: 120 mg/dL (ref <=130)
TRIGLYCERIDES: 63 mg/dL (ref <150)
VLDL CALC: 13 mg/dL (ref 5–35)

## 2018-09-11 LAB — THYROID STIMULATING HORMONE WITH FREE T4 REFLEX: TSH: 2.182 u[IU]/mL (ref 0.340–5.330)

## 2018-09-13 ENCOUNTER — Ambulatory Visit (INDEPENDENT_AMBULATORY_CARE_PROVIDER_SITE_OTHER): Payer: 59

## 2018-09-13 VITALS — Temp 98.2°F

## 2018-09-13 DIAGNOSIS — Z23 Encounter for immunization: Secondary | ICD-10-CM

## 2018-09-13 NOTE — Nursing Note (Signed)
Patient here today for flu vaccination. No signs of illness, no allergies to eggs or egg products, no previous reactions, no history of GBS. VIS given and consent signed by patient.    Vitals:    09/13/18 1919   Temp: 36.8 C (98.2 F)   TempSrc: Oral                 Patient tolerated well /okay.  Luther Bradley, RN  09/13/2018, 19:21

## 2018-11-29 ENCOUNTER — Ambulatory Visit (INDEPENDENT_AMBULATORY_CARE_PROVIDER_SITE_OTHER)
Admission: RE | Admit: 2018-11-29 | Discharge: 2018-11-29 | Disposition: A | Discharge status: Home / Self Care | Payer: No Typology Code available for payment source | Source: Ambulatory Visit | Attending: Physician Assistant | Admitting: Physician Assistant

## 2018-11-29 ENCOUNTER — Ambulatory Visit (INDEPENDENT_AMBULATORY_CARE_PROVIDER_SITE_OTHER): Payer: No Typology Code available for payment source | Admitting: Physician Assistant

## 2018-11-29 ENCOUNTER — Encounter (INDEPENDENT_AMBULATORY_CARE_PROVIDER_SITE_OTHER): Payer: Self-pay

## 2018-11-29 VITALS — BP 105/60 | HR 68 | Temp 98.8°F | Resp 17 | Ht 68.0 in | Wt 275.0 lb

## 2018-11-29 DIAGNOSIS — W19XXXA Unspecified fall, initial encounter: Secondary | ICD-10-CM

## 2018-11-29 DIAGNOSIS — S96912A Strain of unspecified muscle and tendon at ankle and foot level, left foot, initial encounter: Secondary | ICD-10-CM

## 2018-11-29 DIAGNOSIS — S99922A Unspecified injury of left foot, initial encounter: Secondary | ICD-10-CM

## 2018-11-29 DIAGNOSIS — S8992XA Unspecified injury of left lower leg, initial encounter: Secondary | ICD-10-CM

## 2018-11-29 DIAGNOSIS — S86912A Strain of unspecified muscle(s) and tendon(s) at lower leg level, left leg, initial encounter: Secondary | ICD-10-CM

## 2018-11-29 MED ORDER — KETOROLAC TROMETHAMINE 60 MG/2ML IM SOLN
60.0000 mg | Freq: Once | INTRAMUSCULAR | Status: AC
Start: 2018-11-29 — End: 2018-11-29
  Administered 2018-11-29: 60 mg via INTRAMUSCULAR

## 2018-11-29 NOTE — Progress Notes (Signed)
Subjective:    Patient ID: Cynthia Rojas is a 38 y.o. female.    HPI  38 year old female presenting today after a fall that happened on November 26, 2018.  She states that she was going down a step lost her balance and her foot slipped out from underneath her and she fell.  She states that she did break last with this.  She was in [place] at the time and was not evaluated.  She states that her pain now is in the left knee, left ankle and left foot.  She also has some pain in the left shin.  She states that the knee is improving.  She denies prior injuries or fractures to these.  She has been taking Advil about 800 mg every now and then her last dose being last night.  She has been hobbling around on her heel and does not have crutches at home.  She states that she has stopped driving due to the pain and feeling like she is unable to.  She denies losing consciousness or hitting her head.    The following portions of the patient's history were reviewed and updated as appropriate: allergies, current medications, past family history, past medical history, past social history, past surgical history and problem list.    Review of Systems   Constitutional: Negative for fever.   Respiratory: Negative for shortness of breath.    Cardiovascular: Negative for chest pain.   Gastrointestinal: Negative for vomiting.   Musculoskeletal: Positive for arthralgias, joint swelling and myalgias.   Skin: Negative for rash.         Objective:    BP 105/60    Pulse 68    Temp 98.8 F (37.1 C) (Oral)    Resp 17    Ht 1.727 m (5\' 8" )    Wt 124.7 kg (275 lb)    BMI 41.81 kg/m     Physical Exam  Vitals signs and nursing note reviewed.   Constitutional:       Appearance: She is well-developed.   HENT:      Head: Normocephalic and atraumatic.      Right Ear: External ear normal.      Left Ear: External ear normal.   Eyes:      Conjunctiva/sclera: Conjunctivae normal.   Neck:      Musculoskeletal: Normal range of motion and neck supple.    Cardiovascular:      Rate and Rhythm: Normal rate and regular rhythm.      Heart sounds: Normal heart sounds. No murmur.   Pulmonary:      Effort: Pulmonary effort is normal.      Breath sounds: Normal breath sounds. No wheezing or rales.   Musculoskeletal:      Left knee: She exhibits normal range of motion, no deformity, no LCL laxity and no MCL laxity. Tenderness found. No medial joint line and no lateral joint line tenderness noted.      Left ankle: She exhibits decreased range of motion ( limited ankle flexion>extension, eversion>inversion) and swelling. She exhibits no deformity. Tenderness. Lateral malleolus tenderness found.        Legs:       Left foot: Decreased range of motion ( lots of swelling ). Normal capillary refill. Tenderness ( distal aspect of foot ) and swelling present. No deformity.   Lymphadenopathy:      Cervical: No cervical adenopathy.   Skin:     General: Skin is warm and dry.   Neurological:  Mental Status: She is alert and oriented to person, place, and time.       Labs  No results found for this or any previous visit (from the past 24 hour(s)).    Radiology  Xr Knee 4+ Views Left    Result Date: 11/29/2018  No acute osseous abnormality. ReadingStation:[email or web address]    X-ray Tibia Fibula Left Ap And Lateral    Result Date: 11/29/2018  No acute osseous abnormality. ReadingStation:[email or web address]    Xr Ankle Left 3+ Views    Result Date: 11/29/2018  No acute fracture or dislocation. Ankle mortise congruent. Small posterior calcaneal enthesophyte. Soft tissue swelling. ReadingStation:[email or web address]    X-ray Foot Left Ap Lateral And Oblique    Result Date: 11/29/2018  No acute fracture or dislocation. Small dorsal calcaneal enthesophyte. No radiopaque foreign body. Soft tissue swelling. ReadingStation:[email or web address]          Assessment and Plan:       Kip was seen today for fall.    Diagnoses and all orders for this visit:    Fall, initial encounter  -     Crutches  -     ketorolac (TORADOL) IM injection 60 mg     Ankle strain, left, initial encounter  -     XR Ankle Left 3+ Views  -     Ankle splint    Strain of left knee, initial encounter  -     XR Knee 4+ Views Left  -     Apply ace wrap    Foot injury, left, initial encounter  -     X-ray Foot Left AP Lateral and Oblique    Leg injury, left, initial encounter  -     X-ray tibia fibula left AP and lateral      Tolerated toradol shot well. No advil or ibuprofen until 8 hours after injection.   Stirrup and ace applied. Crutches given  -XR: negative  -Counseled on RICE protocols, NSAIDs/tylenol for pain, ace wrap for compression  -consider repeat XR in 7 days to evaluate for occult fracture if not symptomatically improving  -Advised to follow up with PCP if symptoms lasting longer than expected  -Discussed new/worsening symptoms to monitor for and RTC or ER if occur  -Patient/guardian agreed to plan           Delma Officer, PA  [facility]  11/29/2018  12:46 PM

## 2018-11-29 NOTE — Patient Instructions (Signed)
Treating Strains and Sprains    Strains and sprains happen when muscles or other soft tissues near your bones stretch or tear. These injuries can cause bruising, swelling, and pain. To ease your discomfort and speed the healing of your strain or sprain, follow the tips below. Remember, a strain or sprain can take 6 to 8 weeks to heal.  Important Note: Do not give aspirin to children or teens without discussing it with your healthcare provider first.  Ice first, heat later   Use ice for the first 24 to 48 hours after injury. Ice helps prevent swelling and reduce pain. Ice the injury for no more than 20 minutes at a time and allow at least20 minutes between icing sessions.   Apply heat after the first72 hours, once the swelling has gone down. Heat relaxes muscles and increases blood flow. Soak the injured area in warm water or use a heating pad set on low for no more than 15 minutes at a time.  Wrap and elevate   Wrap an injured limb firmly with an elastic bandage. This provides support and helps prevent swelling. Don't wear an elastic bandage overnight. Watch for tingling, numbness, or increased pain. Remove the bandage immediately if any of these occurs.   Elevate the injured area to help reduce swelling and throbbing. It's best to raise an injured limb above the level of your heart.  Medicines   Over-the-counter medicines such as acetaminophen or ibuprofen can help reduce pain. Some also help reduce swelling.   Take medicine only as directed.   Rest the area even if medicines are controlling the pain.  Rest   Rest the injured area by not using it for 24 hours.   When you're ready, return slowly to your normal activities. Rest the injured area often.   Don't use or walk on an injured limb if it hurts.  [employer] last reviewed this educational content on 11/26/2016   2000-2019 [employer] [address], [place] [address]. All rights reserved. This information is not intended as a  substitute for professional medical care. Always follow your healthcare professional's instructions.

## 2018-11-29 NOTE — Progress Notes (Signed)
1 4" ace wrap applied to pt left knee, 1 4" ace wrap applied to pt left foot along with ankle stirup. Pt given crutches along with crutches training. Pt agree to ace wrap comfort and had no further questions at this time.

## 2018-11-29 NOTE — Progress Notes (Signed)
-   MAR ACTION REPORT  (last 24 hrs)         Booker Bhatnagar K       Medication Name Action Time Action Site Route Rate Dose Reason Comments User     ketorolac (TORADOL) IM injection 60 mg 11/29/18 1217 Given Left Ventrogluteal Intramuscular  60 mg   Ayano Douthitt K

## 2018-12-02 ENCOUNTER — Telehealth (INDEPENDENT_AMBULATORY_CARE_PROVIDER_SITE_OTHER): Payer: Self-pay

## 2018-12-02 NOTE — Telephone Encounter (Signed)
Called to check on patient after recent visit.    Patient says swelling has come down but she needs to find an ortho to see. No further questions at this time.

## 2019-03-10 ENCOUNTER — Encounter (INDEPENDENT_AMBULATORY_CARE_PROVIDER_SITE_OTHER): Payer: Self-pay | Admitting: Family

## 2019-07-17 ENCOUNTER — Ambulatory Visit (INDEPENDENT_AMBULATORY_CARE_PROVIDER_SITE_OTHER): Payer: Self-pay | Admitting: Family Medicine

## 2019-07-23 ENCOUNTER — Ambulatory Visit (INDEPENDENT_AMBULATORY_CARE_PROVIDER_SITE_OTHER): Payer: No Typology Code available for payment source | Admitting: Family Medicine

## 2019-08-03 ENCOUNTER — Ambulatory Visit (INDEPENDENT_AMBULATORY_CARE_PROVIDER_SITE_OTHER): Payer: No Typology Code available for payment source | Admitting: Family Medicine

## 2019-08-04 ENCOUNTER — Ambulatory Visit (INDEPENDENT_AMBULATORY_CARE_PROVIDER_SITE_OTHER): Payer: No Typology Code available for payment source | Admitting: Family

## 2019-08-06 ENCOUNTER — Ambulatory Visit (INDEPENDENT_AMBULATORY_CARE_PROVIDER_SITE_OTHER): Payer: No Typology Code available for payment source | Admitting: Family Medicine

## 2019-08-06 ENCOUNTER — Encounter (INDEPENDENT_AMBULATORY_CARE_PROVIDER_SITE_OTHER): Payer: Self-pay | Admitting: Family Medicine

## 2019-08-06 DIAGNOSIS — M545 Low back pain: Secondary | ICD-10-CM

## 2019-08-06 DIAGNOSIS — Z1329 Encounter for screening for other suspected endocrine disorder: Secondary | ICD-10-CM

## 2019-08-06 DIAGNOSIS — Z13220 Encounter for screening for lipoid disorders: Secondary | ICD-10-CM

## 2019-08-06 DIAGNOSIS — Z7689 Persons encountering health services in other specified circumstances: Secondary | ICD-10-CM

## 2019-08-06 DIAGNOSIS — Z6841 Body Mass Index (BMI) 40.0 and over, adult: Secondary | ICD-10-CM

## 2019-08-06 DIAGNOSIS — G8929 Other chronic pain: Secondary | ICD-10-CM

## 2019-08-06 DIAGNOSIS — E119 Type 2 diabetes mellitus without complications: Secondary | ICD-10-CM

## 2019-08-06 NOTE — Progress Notes (Signed)
Chief Complaint   Patient presents with   . Establish Care     weight loss/ lower back pain          SUBJECTIVE:  HISTORY OF PRESENT ILLNESS:    38 year old female presents to establish care. She reports history of low back pain with severe pain, spasms, and sciatica at times. Responded to muscle relaxers. Doing well currently.     Also reports a rare, sharp, short lived well localized anterior chest pain, that lasts seconds to a couple minutes, does not radiate, and is not exertional or pleuritic. She has no associated dizziness, lightheadedness, or palpitations.     Struggles with weight and has had no real sustained response to diet and exercise interventions.     Moderate depression symptoms not interested in medication or referral at this time. No SI.    Review of Systems   Constitutional: Negative for activity change, appetite change, chills, diaphoresis, fatigue, fever and unexpected weight change.   HENT: Negative for congestion, ear pain, rhinorrhea, sinus pressure, sinus pain, sneezing, sore throat and trouble swallowing.    Respiratory: Negative for cough, chest tightness, shortness of breath and wheezing.    Cardiovascular: Negative for chest pain and palpitations.   Gastrointestinal: Negative for abdominal distention, diarrhea, nausea and vomiting.   Neurological: Negative for dizziness, syncope and light-headedness.   Hematological: Negative for adenopathy.   Psychiatric/Behavioral: Negative for agitation, self-injury and suicidal ideas.         Past Medical History:   Diagnosis Date   . No known health problems      Current Outpatient Medications on File Prior to Visit   Medication Sig Dispense Refill   . levonorgestrel (MIRENA) 20 MCG/24HR IUD 1 each by Intrauterine route once       No current facility-administered medications on file prior to visit.       Allergies   Allergen Reactions   . Amoxicillin Rash     Past Surgical History:   Procedure Laterality Date   . NO PAST SURGERIES       Social  History     Tobacco Use   . Smoking status: Never Smoker   . Smokeless tobacco: Never Used   Substance Use Topics   . Alcohol use: Yes     Comment: social   . Drug use: Never     Family History   Problem Relation Age of Onset   . No known problems Mother    . No known problems Father    . Crohn's disease Maternal Grandmother    . Heart disease Maternal Grandfather      Body mass index is 43.49 kg/m.    OBJECTIVE:   BP 104/64 (BP Site: Left arm, Patient Position: Sitting, Cuff Size: Large)   Pulse 77   Temp 97.9 F (36.6 C) (Temporal)   Resp 16   Ht 1.727 m (5\' 8" )   Wt 129.7 kg (286 lb)   SpO2 96%   BMI 43.49 kg/m   Physical Exam  Vitals signs and nursing note reviewed.   Constitutional:       General: She is not in acute distress.     Appearance: She is obese. She is not ill-appearing, toxic-appearing or diaphoretic.   HENT:      Head: Normocephalic and atraumatic.      Right Ear: Tympanic membrane, ear canal and external ear normal. There is no impacted cerumen.      Left Ear: Tympanic membrane, ear  canal and external ear normal. There is no impacted cerumen.      Nose: Nose normal. No congestion or rhinorrhea.      Mouth/Throat:      Mouth: Mucous membranes are moist.      Pharynx: Oropharynx is clear. No oropharyngeal exudate or posterior oropharyngeal erythema.   Eyes:      General: No scleral icterus.        Right eye: No discharge.         Left eye: No discharge.      Conjunctiva/sclera: Conjunctivae normal.      Pupils: Pupils are equal, round, and reactive to light.   Neck:      Musculoskeletal: No neck rigidity.   Cardiovascular:      Rate and Rhythm: Normal rate and regular rhythm.      Heart sounds: No murmur. No friction rub. No gallop.    Pulmonary:      Effort: Pulmonary effort is normal. No respiratory distress.      Breath sounds: Normal breath sounds. No stridor. No wheezing, rhonchi or rales.   Abdominal:      General: Abdomen is flat. Bowel sounds are normal. There is no distension.       Palpations: There is no mass.      Tenderness: There is no abdominal tenderness. There is no guarding or rebound.      Hernia: No hernia is present.   Musculoskeletal:         General: No swelling or deformity.   Lymphadenopathy:      Cervical: No cervical adenopathy.   Skin:     General: Skin is warm and dry.      Coloration: Skin is not jaundiced or pale.   Neurological:      General: No focal deficit present.      Mental Status: She is alert and oriented to person, place, and time.   Psychiatric:         Mood and Affect: Mood normal.         Behavior: Behavior normal.         Thought Content: Thought content normal.         Judgment: Judgment normal.        ASSESSMENT/PLAN:    38 year old female with:    1. Morbid obesity  Discussed diet, exercise, medication, and surgical intervention for weight loss. Dietary calorie restriction to 1800kcal per day. Exercise 30 minutes moderate intensity cardio per day. Referral placed to bariatric/metabolic clinic.  - Ambulatory referral to Bariatric and Metabolic Program    2. Type 2 diabetes mellitus without complication, without long-term current use of insulin  Diet controlled T2DM Hgb A1c 6.5% in Jan 2019. Due for repeat hgb A1c, urine albumin screening, and CMP to assess baseline renal function and LFT.  - VH Microalbumin/Creatinine Ratio; Future  - Hemoglobin A1C; Future  - Comprehensive metabolic panel; Future    3. Screening, lipid    - Lipid panel; Future    4. Screening for thyroid disorder    - TSH; Future    5. Encounter to establish care with new doctor    6. Chronic low back pain, unspecified back pain laterality, unspecified whether sciatica present  Stable at this time. Discussed core stretch, stabilizing, and strengthening with gentle yoga. Follow up PRN.

## 2019-08-07 ENCOUNTER — Ambulatory Visit (INDEPENDENT_AMBULATORY_CARE_PROVIDER_SITE_OTHER): Payer: No Typology Code available for payment source

## 2019-08-07 ENCOUNTER — Other Ambulatory Visit
Admission: RE | Admit: 2019-08-07 | Discharge: 2019-08-07 | Disposition: A | Discharge status: Home / Self Care | Payer: No Typology Code available for payment source | Source: Ambulatory Visit | Attending: Family Medicine | Admitting: Family Medicine

## 2019-08-07 ENCOUNTER — Encounter (INDEPENDENT_AMBULATORY_CARE_PROVIDER_SITE_OTHER): Payer: Self-pay | Admitting: Family Medicine

## 2019-08-07 DIAGNOSIS — Z13220 Encounter for screening for lipoid disorders: Secondary | ICD-10-CM

## 2019-08-07 DIAGNOSIS — Z1329 Encounter for screening for other suspected endocrine disorder: Secondary | ICD-10-CM

## 2019-08-07 DIAGNOSIS — E119 Type 2 diabetes mellitus without complications: Secondary | ICD-10-CM

## 2019-08-07 LAB — COMPREHENSIVE METABOLIC PANEL
ALT: 30 U/L (ref 0–55)
AST (SGOT): 22 U/L (ref 10–42)
Albumin/Globulin Ratio: 1.03 Ratio (ref 0.80–2.00)
Albumin: 3.5 gm/dL (ref 3.5–5.0)
Alkaline Phosphatase: 60 U/L (ref 40–145)
Anion Gap: 13.2 mMol/L (ref 7.0–18.0)
BUN / Creatinine Ratio: 15.6 Ratio (ref 10.0–30.0)
BUN: 12 mg/dL (ref 7–22)
Bilirubin, Total: 0.4 mg/dL (ref 0.1–1.2)
CO2: 27 mMol/L (ref 20–30)
Calcium: 8.8 mg/dL (ref 8.5–10.5)
Chloride: 104 mMol/L (ref 98–110)
Creatinine: 0.77 mg/dL (ref 0.60–1.20)
EGFR: 98 mL/min/{1.73_m2} (ref 60–150)
Globulin: 3.4 gm/dL (ref 2.0–4.0)
Glucose: 177 mg/dL — ABNORMAL HIGH (ref 71–99)
Osmolality Calculated: 282 mOsm/kg (ref 275–300)
Potassium: 5.2 mMol/L (ref 3.5–5.3)
Protein, Total: 6.9 gm/dL (ref 6.0–8.3)
Sodium: 139 mMol/L (ref 136–147)

## 2019-08-07 LAB — LIPID PANEL
Cholesterol: 155 mg/dL (ref 75–199)
Coronary Heart Disease Risk: 3.78
HDL: 41 mg/dL — ABNORMAL LOW (ref 45–65)
LDL Calculated: 99 mg/dL
Triglycerides: 74 mg/dL (ref 10–150)
VLDL: 15 (ref 0–40)

## 2019-08-07 LAB — HEMOGLOBIN A1C: Hgb A1C, %: 7 %

## 2019-08-07 LAB — TSH: TSH: 1.72 u[IU]/mL (ref 0.40–4.20)

## 2019-08-07 LAB — VH MICROALBUMIN / CREATININE RATIO
Microalbumin-Random, U: 5.4 ug/mL (ref 0.0–20.0)
Microalbumin/Creatinine Ratio: 4
Urine Creatinine Random: 133 mg/dL

## 2019-08-07 NOTE — Progress Notes (Signed)
Pt had labs drawn today; First attempt successful in the Left AC with a 22g straight needle. Pt tolerated well.     Stockton Nunley

## 2019-08-12 ENCOUNTER — Telehealth (INDEPENDENT_AMBULATORY_CARE_PROVIDER_SITE_OTHER): Payer: Self-pay | Admitting: Family Medicine

## 2019-08-12 NOTE — Telephone Encounter (Signed)
-----   Message from Alton Revere, MD sent at 08/12/2019 10:01 AM EDT -----  Please call and notify: Needs follow up appointment to discuss lab results, including diabetes management, cholesterol, referral for diabetic eye exam, and in clinic diabetic foot exam.

## 2019-08-12 NOTE — Telephone Encounter (Signed)
Spoke with patient and let her know she needs a follow up appointment and she said she would call back to make it.

## 2019-08-14 ENCOUNTER — Ambulatory Visit (INDEPENDENT_AMBULATORY_CARE_PROVIDER_SITE_OTHER): Payer: No Typology Code available for payment source | Admitting: Family Medicine

## 2019-08-14 ENCOUNTER — Encounter (INDEPENDENT_AMBULATORY_CARE_PROVIDER_SITE_OTHER): Payer: Self-pay | Admitting: Family Medicine

## 2019-08-14 VITALS — BP 107/52 | HR 79 | Temp 98.5°F | Resp 16 | Ht 68.0 in | Wt 285.0 lb

## 2019-08-14 DIAGNOSIS — E119 Type 2 diabetes mellitus without complications: Secondary | ICD-10-CM

## 2019-08-14 MED ORDER — METFORMIN HCL 500 MG PO TABS
500.0000 mg | ORAL_TABLET | Freq: Two times a day (BID) | ORAL | 2 refills | Status: DC
Start: ? — End: 2019-08-14

## 2019-08-14 NOTE — Patient Instructions (Signed)
Understanding Type 2 Diabetes  When your body is working normally, the food you eat is digested and used as fuel. This fuel gives energy to the body's cells. When you have diabetes, the fuel can't enter the cells. If not treated, diabetes can cause serious long-term health problems.     Your body breaks down the food you eat into glucose.   How the body gets energy  The digestive system breaks down food. This results in a sugar called glucose. Some glucose is stored in the liver. But most of it enters the blood. It travels to the cells. They use it as fuel. Glucose needs the help of a hormone called insulin to enter the cells. Insulin is made in the pancreas. It is sent into the bloodstream. This is a response to glucose in the blood. Think of insulin as a key. When insulin reaches a cell, it attaches to the cell wall. This signals the cell to make an opening. Then glucose can enter the cell.    When you have type 2 diabetes  Early in type 2 diabetes, your cells don't respond correctly to insulin. Because of this, less glucose than normal moves into cells. This is called insulin resistance. The pancreas then makes more insulin. But over time, the pancreas can't make enough insulin to overcome insulin resistance. Less and less glucose enters cells. It builds up to a harmful level in the bloodstream. This is known as high blood sugar (hyperglycemia). The result is type 2 diabetes. The cells become starved for energy. This can make you feel tired and rundown.    Why high blood sugar is a problem  If high blood sugar is not controlled, blood vessels all over the body become damaged. Ongoing high blood sugar affects organs, blood vessels, and nerves. This raises the risk of damage to the heart, kidneys, eyes, nerves, and limbs. Diabetes also makes other problems more dangerous. These include high blood pressure, high cholesterol, and triglycerides. Over time, people with uncontrolled high blood sugar have a greater  risk of being disabled or dying from some conditions. These include heart attack, heart failure,or stroke. They may also have problems in their eyes, kidneys, and nerves. These problems are from injury to small blood vessels.  [employer]Well last reviewed this educational content on 09/26/2017   2000-2020 [employer] [place]. [address] [place], [place] [address]. All rights reserved. This information is not intended as a substitute for professional medical care. Always follow your healthcare professional's instructions.      Understanding Carbohydrates    A car needs the right type of fuel to run. And you need the right kind of food to function. To keep your energy level up, your body needs food that has carbohydrates. But carbs raise blood sugar levels higher and faster than other kinds of food. Your dietitian will work with you to figure out the amount of carbs you need. Carbs come in 3 types: starches, sugars, and fiber.  Starches  Starches are found in grains, some vegetables, and beans. Grain products include bread, pasta, cereal, and tortillas. Starchy vegetables include potatoes, peas, corn, lima beans, yams, and squash. Kidney beans, pinto beans, black beans, garbanzo beans, and lentils also have starches.  Sugars  Sugars are found naturally in many foods. Or they can be added. Foods that contain natural sugar include fruits and fruit juices, dairy products, honey, and molasses. Added sugars are found in most desserts, processed foods, candy, regular soda, and fruit  drinks. These are very helpful to treat low blood sugar (hypoglycemia). They give you sugar quickly. Try to keep at least 15 to 20 grams of these simple sugars with you at all times. Eat or drink these if you start to have symptoms of low blood sugar.  Fiber  Fiber comes from plant foods. Your body can't digest most fiber. Instead of raising blood sugar levels like other carbs, fiber stops blood sugar from rising too fast. Fiber is found in  fruits, vegetables, whole grains, beans, peas, and many nuts.  Carb counting  Keep track of the amount of carbs you eat. This can help you keep the right balance of carbs, physical activity, and medicine. The amount of carbs you need will be different from what other people need. How much you need depends on many things. These include your health, the medicines you take, and how active you are. Your healthcare team will help you figure out the right amount of carbs for you. You may start with 45 to 60 grams of carbs per meal, depending on your case. Carb counting is a system that helps you keep track of the carbohydrates you eat at each meal.  Carbs come from a variety of foods. These include grains, starchy vegetables, fruit, milk, beans, and snack foods. You can either count carbohydrate grams or carbohydrate servings. When you count carbohydrate servings, 1 carbohydrate serving = 15 grams of carbohydrates.  Here are some examples of foods that have about 15 grams of carbohydrates (1 serving of carbohydrates):   1/2 cup of canned or frozen fruit   A small piece of fresh fruit (4 ounces)   1 slice of bread   1/2 cup of oatmeal   1/3 cup of rice   4 to 6 crackers   1/2 English muffin   1/2 cup of black beans   1/4 of a large baked potato (3 ounces)   2/3 cup of plain fat-free yogurt   1 cup of soup   1/2 cup of casserole   6 chicken nuggets   2-inch-square brownie or cake without frosting   2 small cookies   1/2 cup of ice cream or sherbet  Carb counting is easier when food labels are available. Look at the label to see how many grams of total carbohydrates per serving the food contains. Then you can figure out how much you should eat. If your food doesn't have a nutrition label, you should be able to get an idea how many carbs there are per serving by using a book or website.  Two very important lines to look at on the label are the serving size and the total carbohydrate amount per serving. Here  are some tips for using food labels to count your carbs:   Check the serving size. The information on the label is based on that serving size. If you eat more than the listed serving size, you may have to double or triple the other information on the label.   Check the total grams of carbohydrates. Total carbohydrate from the label includes sugar, starch, and fiber. Be sure to use the total carbohydrate number and not sugar alone.   Know how many grams of carbs you can have. Be familiar with the matching portion sizes.   Compare labels.Compare the labels of different products. Look at serving sizes and total carbs to find the products that work best for you.   Don't forget protein and fat. With the focus on carb  counting, it might be easy to forget protein and fat in your meals. Don't forget to include sources of protein and healthy fat to balance your meals. Also watch how much salt (sodium) you eat. This is especially true if you have high blood pressure. If you have diabetes, limit the amount of sodium to less than 2,300 mg a day.  It's also important to be consistent with the amount of carbs and time you eat when taking a fixed dose of diabetes medicine. Work with your healthcare provider or dietitian if you need more help. He or she can help you keep track of your carbs. He or she can also help you figure out how many grams of carbs you should have.  StayWell last reviewed this educational content on 07/27/2017   2000-2020 [employer] [place]. [address] [place], [place] [address]. All rights reserved. This information is not intended as a substitute for professional medical care. Always follow your healthcare professional's instructions.

## 2019-08-14 NOTE — Progress Notes (Signed)
Chief Complaint   Patient presents with   . Follow-up     lab results        SUBJECTIVE:  HISTORY OF PRESENT ILLNESS:    38 year old female presents for follow up of lab results.           Review of Systems   Constitutional: Negative for appetite change, chills, diaphoresis, fatigue and fever.   HENT: Negative for congestion, rhinorrhea, sinus pressure, sinus pain and sore throat.    Respiratory: Negative for cough, chest tightness, shortness of breath, wheezing and stridor.    Cardiovascular: Negative for chest pain and palpitations.   Gastrointestinal: Negative for diarrhea, nausea and vomiting.   Endocrine: Negative for cold intolerance, heat intolerance, polydipsia and polyuria.   Musculoskeletal: Negative for myalgias.   Neurological: Negative for seizures and syncope.         Past Medical History:   Diagnosis Date   . Dysthymia 12/11/2017   . Morbid obesity with BMI of 40.0-44.9, adult 02/11/2017   . No known health problems    . Type 2 diabetes mellitus 12/06/2017    HgA1C 6.5 % on 12/05/2017     Current Outpatient Medications on File Prior to Visit   Medication Sig Dispense Refill   . levonorgestrel (MIRENA) 20 MCG/24HR IUD 1 each by Intrauterine route once       No current facility-administered medications on file prior to visit.       Allergies   Allergen Reactions   . Amoxicillin Rash     Past Surgical History:   Procedure Laterality Date   . NO PAST SURGERIES       Social History     Tobacco Use   . Smoking status: Never Smoker   . Smokeless tobacco: Never Used   Substance Use Topics   . Alcohol use: Yes     Comment: social   . Drug use: Never     Family History   Problem Relation Age of Onset   . No known problems Mother    . No known problems Father    . Crohn's disease Maternal Grandmother    . Heart disease Maternal Grandfather      Body mass index is 43.33 kg/m.    OBJECTIVE:   BP 107/52 (BP Site: Left arm, Patient Position: Sitting, Cuff Size: Large)   Pulse 79   Temp 98.5 F (36.9 C) (Temporal)    Resp 16   Ht 1.727 m (5\' 8" )   Wt 129.3 kg (285 lb)   SpO2 98%   BMI 43.33 kg/m   Physical Exam  Vitals signs reviewed.   Constitutional:       General: She is not in acute distress.     Appearance: She is not ill-appearing, toxic-appearing or diaphoretic.   HENT:      Head: Normocephalic and atraumatic.      Right Ear: External ear normal.      Left Ear: External ear normal.      Nose: Nose normal.   Eyes:      General: No scleral icterus.        Right eye: No discharge.         Left eye: No discharge.   Neck:      Musculoskeletal: No neck rigidity.   Pulmonary:      Effort: Pulmonary effort is normal. No respiratory distress.      Breath sounds: No stridor.   Musculoskeletal:      Right  foot: No deformity, bunion or Charcot foot.      Left foot: No deformity, bunion or Charcot foot.   Feet:      Right foot:      Protective Sensation: 10 sites tested. 10 sites sensed.      Skin integrity: Skin integrity normal.      Left foot:      Protective Sensation: 10 sites tested. 10 sites sensed.      Skin integrity: Skin integrity normal.   Skin:     Coloration: Skin is not jaundiced or pale.   Neurological:      General: No focal deficit present.      Mental Status: She is alert and oriented to person, place, and time.   Psychiatric:         Mood and Affect: Mood normal.         Behavior: Behavior normal.         Thought Content: Thought content normal.         Judgment: Judgment normal.        08/07/2019 4:47 PM - Interface, [facility]  [employer] Value Ref Range & Units Status   Hgb A1C, % 7.0  % Final   Methodology: Enzymatic   HgbA1c Interpretive Comment:   Patients with A1c levels in the range of 5.7-6.4% are at increased risk of developing   diabetes   A1c levels at 6.5% or higher are diagnostic of diabetes   Specimens containing increased hemoglobin F levels (greater than 5%) may result in   falsely decreased A1c levels when analyzed by this method   The above 1 analytes were performed by [facility] [phone])   [address] [address]          ASSESSMENT/PLAN:    38 year old female:     1. Type 2 diabetes mellitus without complication, without long-term current use of insulin  Discussed diagnosis, pathophysiology of type 2 diabetes mellitus, importance of dietary sugar reduction, carbohydrate reduction, and total calorie reduction. Discussed importance of regular moderate intensity aerobic activity 30 minutes per day 5 days per week.   Foot exam today normal.   Microalbuminuria screening 07/2019 - negative  Discussed potential benefits of weight loss surgery - previous referral to bariatric and metabolic clinic still pending    25 minutes face-to-face, over half of which was spent counseling or coordinating care.

## 2019-09-05 ENCOUNTER — Ambulatory Visit (INDEPENDENT_AMBULATORY_CARE_PROVIDER_SITE_OTHER): Payer: No Typology Code available for payment source

## 2019-09-05 DIAGNOSIS — Z23 Encounter for immunization: Secondary | ICD-10-CM

## 2019-09-09 ENCOUNTER — Encounter (INDEPENDENT_AMBULATORY_CARE_PROVIDER_SITE_OTHER): Payer: Self-pay

## 2019-09-22 ENCOUNTER — Telehealth (INDEPENDENT_AMBULATORY_CARE_PROVIDER_SITE_OTHER): Payer: No Typology Code available for payment source | Admitting: Physician Assistant

## 2019-09-22 ENCOUNTER — Encounter (INDEPENDENT_AMBULATORY_CARE_PROVIDER_SITE_OTHER): Payer: Self-pay | Admitting: Physician Assistant

## 2019-09-22 VITALS — Temp 98.0°F | Ht 68.0 in | Wt 285.0 lb

## 2019-09-22 DIAGNOSIS — R1084 Generalized abdominal pain: Secondary | ICD-10-CM

## 2019-09-22 DIAGNOSIS — R197 Diarrhea, unspecified: Secondary | ICD-10-CM | POA: Insufficient documentation

## 2019-09-22 DIAGNOSIS — Z9049 Acquired absence of other specified parts of digestive tract: Secondary | ICD-10-CM

## 2019-09-22 DIAGNOSIS — Z8719 Personal history of other diseases of the digestive system: Secondary | ICD-10-CM | POA: Insufficient documentation

## 2019-09-22 DIAGNOSIS — R1031 Right lower quadrant pain: Secondary | ICD-10-CM | POA: Insufficient documentation

## 2019-09-22 DIAGNOSIS — R8271 Bacteriuria: Secondary | ICD-10-CM | POA: Insufficient documentation

## 2019-09-22 DIAGNOSIS — R112 Nausea with vomiting, unspecified: Secondary | ICD-10-CM | POA: Insufficient documentation

## 2019-09-22 NOTE — Progress Notes (Signed)
This visit was conducted with the use of interactive audio/video telecommunication that permitted real-time communication between the patient and myself.  Patient consented to participation and receives services at patient's home, while I was located at [facility].    The patient was evaluated using telehealth platform.  Therefore, I did not lay hands on this patient.  Exam findings are from visual observation and/or having patient perform various physical exam tasks.  Verbal consent for evaluation was obtained.    Subjective:    Patient ID: Cynthia Rojas is a 38 y.o. female.    Pt is evaluated today via telemedicine platform.  She is complaining of abdominal pain that is currently a 4 out of 10 and was previously 8 out of 10 present x4 days with vomiting starting today.  She reports that she had diarrhea which awoke her from sleep early this morning and has been present x approximately one month. She vomited to stomach contents with emesis x1.  Denies fevers, chills, body aches, night sweats, unintentional weight loss.    Abdominal Pain  This is a new problem. The current episode started in the past 7 days. The onset quality is gradual. The problem occurs constantly. The problem has been waxing and waning. The pain is located in the right flank, RLQ and periumbilical region. The pain is at a severity of 4/10. The quality of the pain is cramping and sharp. The abdominal pain does not radiate. Associated symptoms include anorexia, diarrhea, flatus, nausea and vomiting. Pertinent negatives include no arthralgias, belching, constipation, dysuria, fever, frequency, headaches, hematochezia, hematuria, melena, myalgias or weight loss. Exacerbated by: standing and walking. The pain is relieved by nothing. She has tried proton pump inhibitors (rest in fetal position) for the symptoms. The treatment provided moderate relief. cholesystectomy     The following portions of the patient's history were reviewed  and updated as appropriate: allergies, current medications, past family history, past medical history, past social history, past surgical history and problem list.    Review of Systems   Constitutional: Negative for fever and weight loss.   HENT: Negative for sore throat.    Respiratory: Negative for cough.    Cardiovascular: Negative for leg swelling.   Gastrointestinal: Positive for abdominal pain, anorexia, diarrhea, flatus, nausea and vomiting. Negative for constipation, hematochezia and melena.   Endocrine: Negative for polydipsia and polyuria.   Genitourinary: Negative for dysuria, frequency and hematuria.   Musculoskeletal: Negative for arthralgias and myalgias.   Skin: Negative for rash.   Neurological: Negative for dizziness and headaches.   Hematological: Does not bruise/bleed easily.           Objective:  Visit Vitals  Temp 98 F (36.7 C) (Temporal)   Ht 1.727 m (5\' 8" )   Wt 129.3 kg (285 lb)   BMI 43.33 kg/m         Physical Exam  Gen: normal appearance, no acute distress  HEENT: atraumatic, normocephalic, normal external ear, no tragus tenderness (patient self-exam)   Moist oral mucosa, no erythema and no exudate   No Sinus tenderness (patient self-exam)  Eyes: normal conjunctiva  Neck: Supple, no lymphadenopathy (patient self-exam)  Respiratory: normal effort, no tachypnea, can speak full sentence, no wheeze or cough on forced expiration        Cardiovascular: regular rhythm (patient self-exam)  Abdomen: soft, non-tender (patient self-exam), no CVA tenderness (patient self-exam)  Musculoskeletal: no visible swelling, no erythema, no warm to touch (patient self-exam), no tenderness (patient self-exam), normal  ROM of the joint  Skin: no rash, no ecchymosis   Neurological: alert, oriented x3, no focal deficit noted, normal gait  Psychiatric/behavioral:  normal mood and normal thought content        Assessment:       1. Generalized abdominal pain    2. Non-intractable vomiting with nausea, unspecified  vomiting type    3. History of cholecystectomy          Plan:       1. Generalized abdominal pain    2. Non-intractable vomiting with nausea, unspecified vomiting type    3. History of cholecystectomy    Pt encouraged to go to ED for evaluation and treatment of abdominal pain. Pt is asked to hold metformin and discuss with PCP. Discussed risks of not going to ED. If she does not go to ED, needs to visit with PCP tomorrow for possible further workup, imaging, studies, etc.    -Follow up with PCP.  -RTC if worsening or not resolving as expected. ED with any concerning symptoms. Questions answered in full. Patient/guardian are in agreement with the plan.

## 2019-09-23 ENCOUNTER — Emergency Department
Admission: EM | Admit: 2019-09-23 | Discharge: 2019-09-23 | Disposition: A | Discharge status: Home / Self Care | Payer: No Typology Code available for payment source | Attending: Physician Assistant | Admitting: Physician Assistant

## 2019-09-23 ENCOUNTER — Emergency Department: Payer: No Typology Code available for payment source

## 2019-09-23 DIAGNOSIS — R109 Unspecified abdominal pain: Secondary | ICD-10-CM

## 2019-09-23 DIAGNOSIS — R8271 Bacteriuria: Secondary | ICD-10-CM

## 2019-09-23 DIAGNOSIS — Z8719 Personal history of other diseases of the digestive system: Secondary | ICD-10-CM

## 2019-09-23 DIAGNOSIS — R197 Diarrhea, unspecified: Secondary | ICD-10-CM

## 2019-09-23 LAB — CBC AND DIFFERENTIAL
Basophils %: 0.4 % (ref 0.0–3.0)
Basophils Absolute: 0.1 10*3/uL (ref 0.0–0.3)
Eosinophils %: 5.2 % (ref 0.0–7.0)
Eosinophils Absolute: 0.7 10*3/uL (ref 0.0–0.8)
Hematocrit: 48.3 % — ABNORMAL HIGH (ref 36.0–48.0)
Hemoglobin: 15.6 gm/dL (ref 12.0–16.0)
Lymphocytes Absolute: 2.1 10*3/uL (ref 0.6–5.1)
Lymphocytes: 15.5 % (ref 15.0–46.0)
MCH: 29 pg (ref 28–35)
MCHC: 32 gm/dL (ref 32–36)
MCV: 90 fL (ref 80–100)
MPV: 7.7 fL (ref 6.0–10.0)
Monocytes Absolute: 0.6 10*3/uL (ref 0.1–1.7)
Monocytes: 4.3 % (ref 3.0–15.0)
Neutrophils %: 74.5 % (ref 42.0–78.0)
Neutrophils Absolute: 10 10*3/uL — ABNORMAL HIGH (ref 1.7–8.6)
PLT CT: 312 10*3/uL (ref 130–440)
RBC: 5.36 10*6/uL — ABNORMAL HIGH (ref 3.80–5.00)
RDW: 12.4 % (ref 11.0–14.0)
WBC: 13.4 10*3/uL — ABNORMAL HIGH (ref 4.0–11.0)

## 2019-09-23 LAB — VH URINALYSIS WITH MICROSCOPIC AND CULTURE IF INDICATED
Bilirubin, UA: NEGATIVE
Blood, UA: NEGATIVE
Glucose, UA: NEGATIVE mg/dL
Ketones UA: 20 mg/dL — AB
Leukocyte Esterase, UA: 250 Leu/uL — AB
Nitrite, UA: NEGATIVE
Protein, UR: NEGATIVE mg/dL
RBC, UA: 3 /hpf (ref 0–5)
Squam Epithel, UA: 4 /hpf — ABNORMAL HIGH (ref 0–2)
Urine Specific Gravity: 1.035 (ref 1.001–1.040)
Urobilinogen, UA: NORMAL mg/dL
WBC, UA: 7 /hpf — ABNORMAL HIGH (ref 0–4)
pH, Urine: 5 pH (ref 5.0–8.0)

## 2019-09-23 LAB — HCG, SERUM, QUALITATIVE: BHCG Qualitative: NEGATIVE

## 2019-09-23 LAB — COMPREHENSIVE METABOLIC PANEL
ALT: 57 U/L — ABNORMAL HIGH (ref 0–55)
AST (SGOT): 33 U/L (ref 10–42)
Albumin/Globulin Ratio: 1 Ratio (ref 0.80–2.00)
Albumin: 3.6 gm/dL (ref 3.5–5.0)
Alkaline Phosphatase: 65 U/L (ref 40–145)
Anion Gap: 14.3 mMol/L (ref 7.0–18.0)
BUN / Creatinine Ratio: 16 Ratio (ref 10.0–30.0)
BUN: 13 mg/dL (ref 7–22)
Bilirubin, Total: 0.6 mg/dL (ref 0.1–1.2)
CO2: 22 mMol/L (ref 20–30)
Calcium: 9.5 mg/dL (ref 8.5–10.5)
Chloride: 106 mMol/L (ref 98–110)
Creatinine: 0.81 mg/dL (ref 0.60–1.20)
EGFR: 92 mL/min/{1.73_m2} (ref 60–150)
Globulin: 3.6 gm/dL (ref 2.0–4.0)
Glucose: 177 mg/dL — ABNORMAL HIGH (ref 71–99)
Osmolality Calculated: 280 mOsm/kg (ref 275–300)
Potassium: 4.3 mMol/L (ref 3.5–5.3)
Protein, Total: 7.2 gm/dL (ref 6.0–8.3)
Sodium: 138 mMol/L (ref 136–147)

## 2019-09-23 LAB — LIPASE: Lipase: 20 U/L (ref 8–78)

## 2019-09-23 MED ORDER — DICYCLOMINE HCL 10 MG PO CAPS
10.00 mg | ORAL_CAPSULE | Freq: Four times a day (QID) | ORAL | 0 refills | Status: DC
Start: 2019-09-23 — End: 2019-11-06

## 2019-09-23 MED ORDER — ONDANSETRON HCL 4 MG/2ML IJ SOLN
INTRAMUSCULAR | Status: AC
Start: 2019-09-23 — End: ?
  Filled 2019-09-23: qty 2

## 2019-09-23 MED ORDER — ONDANSETRON 4 MG PO TBDP
4.00 mg | ORAL_TABLET | Freq: Three times a day (TID) | ORAL | 0 refills | Status: DC | PRN
Start: 2019-09-23 — End: 2019-11-06

## 2019-09-23 MED ORDER — SUCRALFATE 1 GM/10ML PO SUSP
1.00 g | Freq: Once | ORAL | Status: AC
Start: 2019-09-23 — End: 2019-09-23
  Administered 2019-09-23: 02:00:00 1 g via ORAL

## 2019-09-23 MED ORDER — SODIUM CHLORIDE 0.9 % IV BOLUS
1000.00 mL | Freq: Once | INTRAVENOUS | Status: AC
Start: 2019-09-23 — End: 2019-09-23
  Administered 2019-09-23: 02:00:00 1000 mL via INTRAVENOUS

## 2019-09-23 MED ORDER — ONDANSETRON HCL 4 MG/2ML IJ SOLN
4.00 mg | Freq: Once | INTRAMUSCULAR | Status: AC
Start: 2019-09-23 — End: 2019-09-23
  Administered 2019-09-23: 02:00:00 4 mg via INTRAVENOUS

## 2019-09-23 MED ORDER — SUCRALFATE 1 GM/10ML PO SUSP
ORAL | Status: AC
Start: 2019-09-23 — End: ?
  Filled 2019-09-23: qty 10

## 2019-09-23 MED ORDER — DICYCLOMINE HCL 10 MG PO CAPS
20.00 mg | ORAL_CAPSULE | Freq: Once | ORAL | Status: DC
Start: 2019-09-23 — End: 2019-09-23

## 2019-09-23 MED ORDER — IOHEXOL 350 MG/ML IV SOLN
100.00 mL | Freq: Once | INTRAVENOUS | Status: AC | PRN
Start: 2019-09-23 — End: 2019-09-23
  Administered 2019-09-23: 100 mL via INTRAVENOUS

## 2019-09-23 NOTE — ED Provider Notes (Signed)
[facility]    Emergency Department        Demographics:     Patient Name: Cynthia Rojas, Cynthia Rojas  DOB: May 30, 1981  Age: 38 y.o.   PCP: Alton Revere, MD    Date of Service: 09/22/2019   MRN:  [id number]  Room:  N1/N1-A    Treating Provider:  Gala Murdoch, PA-C  Supervising Physician:  Myna Bright, MD        History provided by patient    History limited by none      History of Present Illness:     Patient is a 38 y.o. female with past medical history significant for diabetes mellitus and irritable bowel syndrome that presents with abdominal pain.  She began with epigastric abdominal pain 3 days ago that has persisted since, gradually worsening.  Endorses radiation of discomfort into the right upper and right lower abdominal quadrants.  Describes her discomfort as cramping and sharp.  She was unable to sleep tonight when she laid down for bed secondary to her increased pain, which prompts her presentation now.  Associated symptoms are recurrent episodes of nausea/vomiting and diarrhea.  Alleviating factors include nothing attempted.  Aggravating factors include nothing specific identified.  No further complaints or concerns at this time; denies fever/chills, chest pain, palpitations, shortness of breath, cough, hematemesis, hematochezia/melena, urinary symptoms, rash or headache.  Denies recent sick contacts, hospitalization, antibiotic use, foreign travel or consumption of undercooked meats/spoiled dairy products.    Past surgical history is significant for cholecystectomy.      Medical History:     Past Medical History:   Diagnosis Date   . Dysthymia 12/11/2017   . Morbid obesity with BMI of 40.0-44.9, adult 02/11/2017   . No known health problems    . Type 2 diabetes mellitus 12/06/2017    HgA1C 6.5 % on 12/05/2017        Past Surgical History:   Procedure Laterality Date   . NO PAST SURGERIES          Home Medications     Med List Status: In Progress Set By: Cherylann Banas, RN  at 09/23/2019  1:43 AM                levonorgestrel (MIRENA) 20 MCG/24HR IUD     1 each by Intrauterine route once     metFORMIN (Glucophage) 500 MG tablet     Take 1 tablet (500 mg total) by mouth 2 (two) times daily with meals          Allergies   Allergen Reactions   . Amoxicillin Rash        Social History     Tobacco Use   . Smoking status: Never Smoker   . Smokeless tobacco: Never Used   Substance Use Topics   . Alcohol use: Yes     Comment: social   . Drug use: Never        Family History   Problem Relation Age of Onset   . No known problems Mother    . No known problems Father    . Crohn's disease Maternal Grandmother    . Heart disease Maternal Grandfather           Review of Systems:      Review of Systems   Constitutional: Negative for chills and fever.   Respiratory: Negative for cough and shortness of breath.    Cardiovascular: Negative for chest pain and palpitations.   Gastrointestinal:  Positive for abdominal pain, diarrhea, nausea and vomiting. Negative for blood in stool.   Genitourinary: Negative for dysuria and hematuria.   Musculoskeletal: Negative for arthralgias and joint swelling.   Skin: Negative for rash.   Neurological: Negative for headaches.   Hematological: Negative for adenopathy.       Except as marked above as positive, remaining systems negative.       Objective:      Vitals:    09/22/19 2310   BP: 127/89   Pulse: 93   Resp: 18   Temp: 98.3 F (36.8 C)   SpO2: 98%     [x]  Nursing note reviewed [x]  Vitals reviewed     General :  Very well nourished, well developed female resting on examination table.  No acute distress noted.    HENT :  Normocephalic, atraumatic.  Hearing is normal.  Moist mucous membranes.  Dentition is unremarkable.  Eyes :  Conjunctivae and EOMs are normal.  PERRL.  Neck :  Supple.  Full range of motion.  Cardiac :  Regular rate and rhythm.  No murmurs, rubs or gallops noted.  Vascular :  No evidence of cyanosis.  Distal pulses 2+.  Respiratory :  No respiratory  distress; normal effort.  Clear to auscultation bilaterally.  No wheezes, rales or rhonchi noted.  Gastrointestinal :  Body habitus admittedly makes examination challenging.  Nonetheless, mild tenderness to palpation noted diffusely about upper demand as well as to right lower abdominal quadrant.  Soft and nondistended.  Normoactive bowel sounds throughout.  No guarding or rebound tenderness noted.  Genitourinary :  Negative CVA tenderness bilaterally.  Musculoskeletal :  No obvious abnormalities or deformities noted.  Moving all extremities without apparent discomfort or difficulty.  Neurologic :  Alert and oriented to person, place and time.  Sensation is grossly intact.  Normal muscle tone noted.  No focal neurologic deficits.    Skin :  Warm and dry.  No rashes or lesions noted.  Psychiatric :  Appropriate mood and affect.      Diagnostics:      Results     Procedure Component Value Units Date/Time    Urine Culture [[id number]] Collected: 09/23/19 0239    Specimen: Urine, Random Updated: 09/23/19 0307    Narrative:      Specimen: Urine, Random  Collected: 09/23/2019 02:39     Status: Valued      Last Updated: 09/23/2019 03:07                Culture Result (Prelim)      Culture In Progress          Urinalysis w Microscopic and Culture if Indicated [[id number]]  (Abnormal) Collected: 09/23/19 0239    Specimen: Urine, Random Updated: 09/23/19 0256     Color, UA Amber - Color may affect some analytes.     Clarity, UA Slightly Cloudy     Urine Specific Gravity 1.035     pH, Urine 5.0 pH      Protein, UR Negative mg/dL      Glucose, UA Negative mg/dL      Ketones UA 20 mg/dL      Bilirubin, UA Negative     Blood, UA Negative     Nitrite, UA Negative     Urobilinogen, UA Normal mg/dL      Leukocyte Esterase, UA 250 Leu/uL      UR Micro Performed     WBC, UA 7 /hpf  RBC, UA 3 /hpf      Bacteria, UA Few /hpf      Squam Epithel, UA 4 /hpf     Narrative:      A Urine Culture has been ordered based upon the Positive UA  results.    Lipase [[id number]] Collected: 09/23/19 0111    Specimen: Plasma Updated: 09/23/19 0218     Lipase 20 U/L     CBC and differential [[id number]]  (Abnormal) Collected: 09/23/19 0111    Specimen: Blood Updated: 09/23/19 0141     WBC 13.4 K/cmm      RBC 5.36 M/cmm      Hemoglobin 15.6 gm/dL      Hematocrit 64.4 %      MCV 90 fL      MCH 29 pg      MCHC 32 gm/dL      RDW 03.4 %      PLT CT 312 K/cmm      MPV 7.7 fL      Neutrophils % 74.5 %      Lymphocytes 15.5 %      Monocytes 4.3 %      Eosinophils % 5.2 %      Basophils % 0.4 %      Neutrophils Absolute 10.0 K/cmm      Lymphocytes Absolute 2.1 K/cmm      Monocytes Absolute 0.6 K/cmm      Eosinophils Absolute 0.7 K/cmm      Basophils Absolute 0.1 K/cmm     Narrative:      This order is a replacement of the rejected order with accession number [id number].    Comprehensive metabolic panel [[id number]]  (Abnormal) Collected: 09/23/19 0035    Specimen: Plasma Updated: 09/23/19 0120     Sodium 138 mMol/L      Potassium 4.3 mMol/L      Chloride 106 mMol/L      CO2 22 mMol/L      Calcium 9.5 mg/dL      Glucose 841 mg/dL      Creatinine 6.60 mg/dL      BUN 13 mg/dL      Protein, Total 7.2 gm/dL      Albumin 3.6 gm/dL      Alkaline Phosphatase 65 U/L      ALT 57 U/L      AST (SGOT) 33 U/L      Bilirubin, Total 0.6 mg/dL      Albumin/Globulin Ratio 1.00 Ratio      Anion Gap 14.3 mMol/L      BUN / Creatinine Ratio 16.0 Ratio      EGFR 92 mL/min/1.56m2      Osmolality Calculated 280 mOsm/kg      Globulin 3.6 gm/dL     Beta HCG, Qual, Serum [[id number]] Collected: 09/23/19 0035    Specimen: Plasma Updated: 09/23/19 0120     BHCG Qualitative Negative            Ct Abdomen Pelvis With Iv Cont    Result Date: 09/23/2019  1. No acute disease. 2. Hepatomegaly and fatty liver. 3. 4 mm left lower lobe pulmonary nodule. For nodules less than 6 mm in a low risk patient (minimal or absent smoking history, no history of malignancy), no routine followup is recommended. In a high  risk patient (smoking or malignancy history), optional  12 month followup can be obtained. ReadingStation:[id number]        Consultations:      None  Procedures:      ECG Interpretation:  None      ED Course and MDM:     Patient presents to emergency department for evaluation of abdominal pain. Etiologies of this complaint, including but not limited to appendicitis, gallbladder disease, pancreatitis, diverticulitis, bowel obstruction, constipation, urinary tract infection, pyelonephritis, electrolyte abnormality and/or dehydration have been considered.  Laboratory results have been reviewed; results discussed with the patient and/or family.  Radiography results have been reviewed; results discussed with the patient and/or family.    Patient has remained clinically well appearing during serial reevaluations and is suitable for discharge at this time.  Feeling improved following treatments initiated in the emergency department with IV hydration and antiemetic medication (Zofran).  Vital signs remain stable.  Neurologically intact without evidence of weakness or other deficit.  Tolerating oral fluids/foods in the emergency department without complication.  Ambulatory in the emergency department without complication.  Exact etiology of symptomology remains unclear, although workup completed in the emergency department today is reassuring and there do not appear to be indications for further workup nor admission at this time.  She has been unable to provide stool specimen throughout the entirety of her emergency department visit this evening; supplies provided for home stool sample placed for further evaluation.  She does have a history of irritable bowel syndrome and expresses interest in trial of therapy with Bentyl; dose provided prior to discharge as well as prescription for same.  Urinalysis with evidence of slight bacteria, although she is without symptoms of urinary tract infection.  Urine culture has been  obtained and sent; she understands that she will be contacted for any indication to initiate therapy.  Patient will follow-up with primary care provider as an outpatient for reevaluation to assure symptom improvement/resolve as well as for further management, as deemed appropriate, although advised to return to emergency department for any acute concerns in the meantime.  Return precautions discussed.  All questions answered and all concerns addressed.      Assessment:      38 y.o. female with  1. Abdominal pain in female patient    2. Nausea, vomiting, and diarrhea    3. History of irritable bowel syndrome    4. Asymptomatic bacteriuria          Plan:      ED Disposition     ED Disposition Condition Date/Time Comment    Discharge  Wed Sep 23, 2019  3:47 AM Abbie Sons discharge to home/self care.    Condition at disposition: Stable        Discharge Medication List as of 09/23/2019  4:16 AM      START taking these medications    Details   dicyclomine (BENTYL) 10 MG capsule Take 1 capsule (10 mg total) by mouth 4 times daily - with meals and at bedtime, Starting Wed 09/23/2019, E-Rx      ondansetron (ZOFRAN-ODT) 4 MG disintegrating tablet Take 1 tablet (4 mg total) by mouth every 8 (eight) hours as needed for Nausea, Starting Wed 09/23/2019, E-Rx                    This chart was generated with the use of an electronic medical record and may contain errors not intended by the author.    --  Gala Murdoch, PA-C  [facility] Emergency Department  09/23/19 5:00 AM       Gala Murdoch, PA  09/23/19 0500  Myna Bright, MD  09/24/19 [phone]

## 2019-09-23 NOTE — Discharge Instructions (Signed)
Unknown Causes of Abdominal Pain (Female)    The exact cause of your belly (abdominal) pain is not clear. This does not mean that this is something to worry about. Everyone likes to know the exact cause of the problem. But sometimes with belly pain, there is no clear-cut cause, and this could be a good thing. The good news is that your symptoms can be treated, and you will feel better.   Your condition does not seem serious now. But sometimes the signs of a serious problem may take more time to appear. For this reason, it is important for you to watch for any new symptoms, problems, or worsening of your condition.  Over the next few days, the abdominal pain may come and go. Or it may be constant. Other common symptoms can include nausea and vomiting. Sometimes it can be difficult to tell if you feel nauseous. You may just feel bad and not connect that feeling to nausea. Constipation, diarrhea, and a fever may go along with the pain.  The pain may continue even if treated correctly over the following days. Depending on how things go, sometimes the cause can become clear and may need more or different treatment. Additional evaluations, medicines, or tests may also be needed.  Home care  Your healthcare provider may prescribe medicine for pain, symptoms, or an infection.  Follow the healthcare provider's instructions for taking these medicines.  General care  · Rest as much as you can until your next exam. No strenuous activities.  · Try to find positions that ease discomfort. A small pillow placed on the abdomen may help relieve pain.  · Something warm on your abdomen (such as a heating pad) may help, but be careful not to burn yourself.  Diet  · Don’t force yourself to eat, especially if having cramps, vomiting, or diarrhea.  · Water is important so you don't get dehydrated. Soup may also be good. Sports drinks may also help, especially if they are not too acidic. Don't drink sugary drinks as this can make things  worse. Take liquids in small amounts. Don’t guzzle them.  · Caffeine sometimes makes the pain and cramping worse.  · Don’t take dairy products if you have vomiting or diarrhea.  · Don't eat large amounts at a time. Wait a few minutes between bites.  · Eat a diet low in fiber (called a low-residue diet). Foods allowed include refined breads, white rice, fruit and vegetable juices without pulp, tender meats. These foods will pass more easily through the intestine.  · Don’t have whole-grain foods, whole fruits and vegetables, meats, seeds and nuts, fried or fatty foods, dairy, alcohol and spicy foods until your symptoms go away.  Follow-up care  Follow up with your healthcare provider, or as advised, if your pain does not begin to improve in the next 24 hours.  Call 911  Call 911 if any of these occur:  · Trouble breathing  · Confusion  · Fainting or loss of consciousness  · Rapid heart rate  · Seizure  When to seek medical advice  Call your healthcare provider right away if any of these occur:  · Pain gets worse or moves to the right lower abdomen  · New or worsening vomiting or diarrhea  · Swelling of the abdomen  · Unable to pass stool for more than 3 days  · Fever of 100.4ºF (38ºC) or higher, or as directed by your healthcare provider.  · Blood in vomit or bowel movements (dark red or black color)  · Yellow color of eyes   and skin (jaundice)   Weakness, dizziness   Chest, arm, back, neck, or jaw pain   Unexpected vaginal bleeding or missed period   Can't keep down liquids or water and you are getting dehydrated  StayWell last reviewed this educational content on 04/26/2017   2000-2020 [employer], [place]. [address] [place], [place] [address]. All rights reserved. This information is not intended as a substitute for professional medical care. Always follow your healthcare professional's instructions.          Nonspecific Vomiting and Diarrhea (Adult)  Vomiting and diarrhea can have many causes,  including:   Helping your body get rid of harmful substances   Gastroenteritis caused by viruses, parasites,bacteria, or toxins.   Allergy toor side effect ofa food or medicine   Severe stress or worry (anxiety)   Other illnesses   Pregnancy  It is often hard to pinpoint an exact cause, even with testing.Vomiting and diarrhea often go away within a day or two without problems. If they continue, though, they can lead to too much loss of fluid (dehydration). This can be serious if not treated.    Home care  Medicines   You may use acetaminophen or NSAID medicines like ibuprofen or naproxen to control fever, unless another medicine was prescribed. If you have chronic liver or kidney disease, talk with your healthcare provider before using these medicines. Also talk with your provider if you've had a stomach ulcer orgastrointestinal bleeding. Don't give aspirin to anyone under 59 years of age who is ill with a fever because it may cause severe disease or death. Don't use NSAID medicines if you are already taking one for another condition (like arthritis) or are on aspirin (such as for heart disease or after a stroke)   Over-the-counter medicines for diarrhea, nausea, and vomiting are generally OK unless you have bleeding, fever, or severe abdominal pain.  General care   If symptoms are severe, rest at home for the next 24 hours, or until you are feeling better.   Washing your hands with soap and water, or using alcohol-based hand sanitizeris the best way to stop the spread of infection. Wash your hands after touching anyone who is sick.   Wash your hands after using the toilet and before meals. Clean the toilet after each use.   Dry your hands with a single use towel.   Caffeine, tobacco, and alcohol can make the diarrhea, cramping, and pain worse. Remember, caffeine not only isin coffee, but also isin chocolate, some energy drinks, and teas.  Diet   Water and clear liquids are important so you  don't get dehydrated. Drink a small amount at a time. Don't guzzle down the drinks.That may increase your nausea, make cramping worse, andcause the drinksto come back up.   Sports drinks may also help if you are healthy and not too dehydrated. They have too much sugar and not enough electrolytes and can sometimes make things worse. Also, don't drink beverages that are too acidic, like orange juice and grape juice.   If you are very dehydrated, commercially available products called oral rehydration solutions are best.  Food   Don't force yourself to eat, especially if you have cramps, diarrhea, or vomiting.Eat just a little at a time, and then wait a few minutes before you try to eat more.   Don't eat fatty, greasy, spicy, or fried foods.   Don't eat dairy products if you have diarrhea. They can make it worse.  During  the first24 hours(the first full day),follow the diet below:   Beverages: Oral rehydration solutions, sports drinks, soft drinks without caffeine, mineral water, and decaffeinated tea and coffee   Soups: Clear broth, consomm, and bouillon   Desserts: Plain gelatin, popsicles, and fruit juice bars  During the next 24 hours(the second day),you may add the following to the aboveif you are better. If not, continue what you did the first day:   Hot cereal, plain toast, bread, rolls, crackers   Plain noodles, rice, mashed potatoes, chicken noodle or rice soup   Unsweetened canned fruit (avoid pineapple), bananas   Limit fat intake to less than 15 grams per day by avoiding margarine, butter, oils, mayonnaise, sauces, gravies, fried foods, peanut butter, meat, poultry, and fish.   Limit fiber. Avoid raw or cooked vegetables, fresh fruits (except bananas) and bran cereals.   Limit caffeine and chocolate. No spices or seasonings except salt.  During the next 24 hours:   Gradually resume a normal diet, as you feel better and your symptoms improve.   If at any timeyour symptomsstart  getting worse again, go back to clear liquids until you feel better.  Food preparation   If you have diarrhea, youshould not prepare food for others. When preparing foods, wash your hands before and after.   Wash your hands or use alcohol-based sanitizer after using cutting boards, countertops, and knives that have been in contact with raw food.   Dry your hands with a single use towel.   Keep uncooked meats away from cooked and ready-to-eat foods.    Follow-up care  Follow up with your healthcare provider, or as advised. Call if you don't get better in the next 2 to 3 days. If a stool (diarrhea) sample was taken,or cultures done, you will be told if they are positive, or if your treatment needs to be changed. You may call as directed for the results.  If X-rays were taken, you will be notified of any new findings that may affect your care  Call 911  Call 911 if any of these occur:   Trouble breathing   Chest pain   Confusion   Severe drowsiness or trouble awakening   Fainting or loss of consciousness   Rapid heart rate   Seizure   Stiff neck   Severe weakness, dizziness, or lightheadedness  When to seek medical advice  Call your healthcare provider right away if any of these occur:   Bloody or black vomit or stools   Severe, steady abdominal pain or any abdominal pain that is getting worse   Severe headache or stiff neck   An inability to hold down even sips of liquids for more than 12 hours   Vomiting that lasts more than 24 hours   Diarrhea that lasts more than 24 hours   Fever of 100.8F (38.0C) or higher, or as directed by your healthcare provider   Yellowish color to your skin or the whites of your eyes   Signs of dehydration,such as dry mouth, little urine (less than every 6 hours), or very dark urine  StayWell last reviewed this educational content on 04/26/2017   2000-2020 [employer], [place]. [address], [place], [place] [address]. All rights reserved. This information is  not intended as a substitute for professional medical care. Always follow your healthcare professional's instructions.

## 2019-09-23 NOTE — ED Triage Notes (Signed)
Pt to ed r/t abdominal pain. Pt reports on Saturday she started with nausea and Sunday worsening nausea, dizziness, weakness. reports abdominal pain where her gallbladder was. Pt reports back and side pain "it was drastically hurting".

## 2019-09-23 NOTE — ED Notes (Addendum)
Attempted twice both in L and R AC hit too much resistance. Was able to obtain bloodwork.

## 2019-09-24 ENCOUNTER — Inpatient Hospital Stay: Admission: RE | Admit: 2019-09-24 | Payer: Self-pay | Source: Ambulatory Visit

## 2019-09-24 ENCOUNTER — Ambulatory Visit
Admission: RE | Admit: 2019-09-24 | Discharge: 2019-09-24 | Disposition: A | Discharge status: Home / Self Care | Payer: No Typology Code available for payment source | Source: Ambulatory Visit | Attending: Physician Assistant | Admitting: Physician Assistant

## 2019-09-24 DIAGNOSIS — R197 Diarrhea, unspecified: Secondary | ICD-10-CM | POA: Insufficient documentation

## 2019-09-24 DIAGNOSIS — Z8719 Personal history of other diseases of the digestive system: Secondary | ICD-10-CM | POA: Insufficient documentation

## 2019-09-24 DIAGNOSIS — R112 Nausea with vomiting, unspecified: Secondary | ICD-10-CM | POA: Insufficient documentation

## 2019-09-24 DIAGNOSIS — R109 Unspecified abdominal pain: Secondary | ICD-10-CM | POA: Insufficient documentation

## 2019-09-24 LAB — VH ADENOVIRUS, RAPID: Respiratory or Stool Adenovirus Rapid: POSITIVE — CR

## 2019-09-24 LAB — VH NOROVIRUS ASSAY
Norovirus Gengroup I: NOT DETECTED
Norovirus Gengroup II: NOT DETECTED

## 2019-09-24 LAB — VH C. DIFFICILE TOXIN B GENE BY DNA AMPLIFICATION: Stool Clostridium difficile Toxin B Gene DNA Amplification: NEGATIVE

## 2019-09-24 LAB — VH STOOL ROTAVIRUS RAPID TEST: Rotavirus AG, EIA: NOT DETECTED

## 2019-09-24 NOTE — ED Notes (Signed)
Notified patient of positive adenovirus result.  Stool culture still pending, but advised her if positive I wouldnotify her as well.  All other tests for C.Diff, norovirus and rotavirus were negative.  Patient encouraged to drink fluids, handwashing precautions.  Patient verbalized understanding.    Also mentioned to patient regarding pulmonary nodule on CT scan noted in left lower lung, very small 4 mm and she is a non-smoker, very low risk.  Advised patient to follow-up with her PCP to discuss further but per radiology recommendation very low risk and no further studies indicated.

## 2019-10-02 ENCOUNTER — Encounter (INDEPENDENT_AMBULATORY_CARE_PROVIDER_SITE_OTHER): Payer: Self-pay

## 2019-10-31 ENCOUNTER — Other Ambulatory Visit
Admission: RE | Admit: 2019-10-31 | Discharge: 2019-10-31 | Disposition: A | Discharge status: Home / Self Care | Payer: No Typology Code available for payment source | Source: Ambulatory Visit | Attending: Family Medicine | Admitting: Family Medicine

## 2019-10-31 ENCOUNTER — Ambulatory Visit (RURAL_HEALTH_CENTER): Payer: No Typology Code available for payment source

## 2019-10-31 DIAGNOSIS — E119 Type 2 diabetes mellitus without complications: Secondary | ICD-10-CM

## 2019-10-31 LAB — HEMOGLOBIN A1C: Hgb A1C, %: 6.4 %

## 2019-10-31 NOTE — Progress Notes (Signed)
Pt came in for lab draw; First attempt successful in the Left AC with a 22g straight needle. Pt tolerated well.     Cynthia Rojas

## 2019-11-06 ENCOUNTER — Encounter (RURAL_HEALTH_CENTER): Payer: Self-pay | Admitting: Medical

## 2019-11-06 ENCOUNTER — Telehealth (RURAL_HEALTH_CENTER): Payer: No Typology Code available for payment source | Admitting: Medical

## 2019-11-06 ENCOUNTER — Ambulatory Visit (INDEPENDENT_AMBULATORY_CARE_PROVIDER_SITE_OTHER): Payer: No Typology Code available for payment source | Admitting: Family Medicine

## 2019-11-06 VITALS — Temp 97.0°F | Ht 68.0 in | Wt 267.0 lb

## 2019-11-06 DIAGNOSIS — Z6841 Body Mass Index (BMI) 40.0 and over, adult: Secondary | ICD-10-CM

## 2019-11-06 DIAGNOSIS — E119 Type 2 diabetes mellitus without complications: Secondary | ICD-10-CM

## 2019-11-06 DIAGNOSIS — Z8632 Personal history of gestational diabetes: Secondary | ICD-10-CM

## 2019-11-06 NOTE — Progress Notes (Signed)
This visit was conducted with the use of interactive audio/video telecommunication that permitted real-time communication between the patient and myself.  Patient consented to participation and receives services at patient's home, while I was located at [facility].    The patient was evaluated using telehealth platform.  Therefore, I did not lay hands on this patient.  Exam findings are from visual observation and/or having patient perform various physical exam tasks.  Verbal consent for evaluation was obtained.    Subjective:    Patient ID: Cynthia Rojas is a 38 y.o. female.    HPI     Pt presents today for F/U on her diabetes. Reports that she was started on metformin. It caused a lot of diarrhea. She continued medication until her diarrhea got acutely worse. She went to the ER and was found to have adenovirus (09/24/2019). She did not go back on the metformin. Reports that she has been working on losing weight. Sugars seem to be improving, had labs done prior to appointment today.     The following portions of the patient's history were reviewed and updated as appropriate: allergies, current medications, past family history, past medical history, past social history, past surgical history and problem list.    Review of Systems   Constitutional: Negative for appetite change, chills, diaphoresis and fever.   HENT: Negative for ear pain, hearing loss, rhinorrhea and sore throat.    Eyes: Negative for visual disturbance.   Respiratory: Negative for shortness of breath.    Cardiovascular: Negative for chest pain, palpitations and leg swelling.   Gastrointestinal: Negative for abdominal pain, constipation, diarrhea, nausea and vomiting.   Genitourinary: Negative for difficulty urinating, dysuria, frequency and urgency.   Musculoskeletal: Negative for arthralgias.   Skin: Negative for rash.   Neurological: Negative for dizziness and headaches.           Objective:    Physical Exam  Vitals signs and  nursing note reviewed.   Constitutional:       General: She is not in acute distress.     Appearance: Normal appearance. She is normal weight.   HENT:      Head: Normocephalic and atraumatic.   Eyes:      Conjunctiva/sclera: Conjunctivae normal.      Pupils: Pupils are equal, round, and reactive to light.   Neck:      Musculoskeletal: Normal range of motion.   Cardiovascular:      Rate and Rhythm: Normal rate and regular rhythm.      Pulses: Normal pulses.   Pulmonary:      Effort: Pulmonary effort is normal. No respiratory distress.   Skin:     Findings: No rash.   Neurological:      General: No focal deficit present.      Mental Status: She is alert and oriented to person, place, and time.   Psychiatric:         Mood and Affect: Mood normal.         Behavior: Behavior normal.             Assessment:       1. Type 2 diabetes mellitus without complication, without long-term current use of insulin    2. Morbid obesity with BMI of 40.0-44.9, adult    3. Hx of gestational diabetes mellitus, not currently pregnant          Plan:       DM, type 2  - Last A1C 6.4% (  10/31/2019).   - Compared to prior A1C 7.0% (08/07/2019).   - No longer on metformin since it was causing diarrhea.  - Not on any medication at all.  - Continue to work on weight loss.  - Not on statin; not on ACEI.  - Recommended diabetic eye exam.    Repeat labs in 3 months with F/U afterwards.  RTC sooner prn.  Pt understood & agreed with this plan.

## 2019-11-12 ENCOUNTER — Other Ambulatory Visit (INDEPENDENT_AMBULATORY_CARE_PROVIDER_SITE_OTHER): Payer: Self-pay | Admitting: Family Medicine

## 2019-11-12 DIAGNOSIS — E119 Type 2 diabetes mellitus without complications: Secondary | ICD-10-CM

## 2019-11-23 ENCOUNTER — Encounter (HOSPITAL_BASED_OUTPATIENT_CLINIC_OR_DEPARTMENT_OTHER): Payer: Self-pay

## 2019-11-23 NOTE — Progress Notes (Signed)
Update chart. Letters sent to patient and PCP              Date:  09/24/2019  Re:  CT ABDOMEN PELVIS W IV/ WO PO CONT    Dear Tracey Harries  We are writing to inform you that a recent imaging study you had identified unexpected nodule(s) in your lung(s). Nodules (or "spots" on the lung) are very common, and most of them are benign (non-cancerous). We would like to suggest that you make an appointment with your primary care provider (PCP) to discuss these results.  To make sure that the finding is benign, we recommend a CT scan of your chest on or around 09/22/2020.  If you have already discussed this finding with your PCP, please disregard this letter.  If you do not have a Primary Care Provider and would like assistance in obtaining one, please call [phone] to set up an appointment with the [facility].  If you would like to see one of our lung specialists at [facility], call the Lung Nodule Clinic at [phone] to make an appointment or you may make an appointment with a lung specialist of your choice.  As a [facility] patient you have access to My Chart.  If you need assistance creating or accessing your My Chart account please visit [email or web address] or call [phone] and press [phone] for assistance.  Your exam reports and images are kept on file at [facility] as part of your medical record and are available for your continued care.  Thank you for allowing [place] to help meet your health care needs.                       Date: 09/24/2019  RE: CT ABDOMEN PELVIS W IV/ WO PO CONT on 09/23/2019, Griego, Crystina(DOB 1981-08-16, MRN [id number])   ___________________________________________________________________________________    We are writing to inform you that the above mentioned test has identified incidental lung nodule(s).  It is recommended that your patient follow up with a chest CT on or around: 09/22/2020. If you concur with this recommendation, please schedule this  test at your earliest convenience.  The radiology report has been enclosed for your review.  If you feel that it is clinically warranted, you can make a referral to the [facility]od[facility] by calling [phone].  Your patient has also received a letter noting the incidental nodule finding, and has been advised to contact you as their primary care provider.  Thank you for the opportunity to be a community partner in the care of your patient.          Dani Gobble    Pulmonary Diagnostics  [facility] - Pulmonary Disease Navigator  [phone]

## 2020-03-28 ENCOUNTER — Ambulatory Visit (INDEPENDENT_AMBULATORY_CARE_PROVIDER_SITE_OTHER): Payer: Self-pay

## 2020-04-13 ENCOUNTER — Telehealth (INDEPENDENT_AMBULATORY_CARE_PROVIDER_SITE_OTHER): Payer: Self-pay

## 2020-04-13 NOTE — Telephone Encounter (Signed)
Called patient to review surgical benefits. Advised that benefit information is not a guarantee of payment, claims are paid based on medical necessity, recommended patient call to verify.Reviewed criteria and benefit information along with $100.00 program fee. Patient interested in scheduling consult, appointment made, no other questions.

## 2020-04-18 ENCOUNTER — Ambulatory Visit: Payer: No Typology Code available for payment source | Attending: Surgery | Admitting: Surgery

## 2020-04-18 ENCOUNTER — Encounter (INDEPENDENT_AMBULATORY_CARE_PROVIDER_SITE_OTHER): Payer: Self-pay

## 2020-04-18 DIAGNOSIS — R7309 Other abnormal glucose: Secondary | ICD-10-CM

## 2020-04-18 DIAGNOSIS — E559 Vitamin D deficiency, unspecified: Secondary | ICD-10-CM

## 2020-04-18 DIAGNOSIS — K30 Functional dyspepsia: Secondary | ICD-10-CM

## 2020-04-18 DIAGNOSIS — R0602 Shortness of breath: Secondary | ICD-10-CM

## 2020-04-18 DIAGNOSIS — D508 Other iron deficiency anemias: Secondary | ICD-10-CM

## 2020-04-18 DIAGNOSIS — R5383 Other fatigue: Secondary | ICD-10-CM

## 2020-04-18 DIAGNOSIS — F419 Anxiety disorder, unspecified: Secondary | ICD-10-CM

## 2020-04-18 DIAGNOSIS — D520 Dietary folate deficiency anemia: Secondary | ICD-10-CM

## 2020-04-18 DIAGNOSIS — E119 Type 2 diabetes mellitus without complications: Secondary | ICD-10-CM

## 2020-04-18 DIAGNOSIS — E639 Nutritional deficiency, unspecified: Secondary | ICD-10-CM

## 2020-04-18 DIAGNOSIS — E211 Secondary hyperparathyroidism, not elsewhere classified: Secondary | ICD-10-CM

## 2020-04-18 DIAGNOSIS — G43909 Migraine, unspecified, not intractable, without status migrainosus: Secondary | ICD-10-CM | POA: Insufficient documentation

## 2020-04-18 DIAGNOSIS — F32A Depression, unspecified: Secondary | ICD-10-CM | POA: Insufficient documentation

## 2020-04-18 DIAGNOSIS — Z6841 Body Mass Index (BMI) 40.0 and over, adult: Secondary | ICD-10-CM

## 2020-04-18 NOTE — Progress Notes (Signed)
BARIATRIC INITIAL SURGEON VISIT    Chief Complaint  Morbid Obesity (Body mass index is 42.12 kg/m.)    History  Cynthia Rojas has been considering bariatric surgery for 15 years.     HbA1c was 7 to 6.3, she was on Metformin for a couple of months, but she couldn't tolerate it and her HbA1c went down to 6.3, so she is currently off Metformin     The patient's mom was diagnosed with Celiac disease and her grandmother has Crohn's disease.       Past medical and surgical history reviewed.    Review of Systems   Constitutional: Negative for chills and fever.   HENT: Negative for hearing loss and tinnitus.    Eyes: Negative for blurred vision and double vision.   Respiratory: Negative for cough.    Cardiovascular: Negative for chest pain, palpitations and orthopnea.   Gastrointestinal: Negative for abdominal pain, heartburn, nausea and vomiting.   Genitourinary: Negative for dysuria.   Musculoskeletal: Positive for back pain (lower back ). Negative for neck pain.   Skin: Negative for rash.   Neurological: Positive for dizziness (sometimes passes out when she is in pain ) and headaches (Migraine).   Psychiatric/Behavioral: Positive for depression. Negative for suicidal ideas. The patient is nervous/anxious.          Physical exam  Vitals:    04/18/20 1200   BP: 117/69   Pulse: 71   Resp: 16       Weight: 125.6 kg (277 lb)      Body mass index is 42.12 kg/m.    Ideal body weight: 63.9 kg (140 lb 14 oz)  Adjusted ideal body weight: 88.6 kg (195 lb 5.2 oz)      General Appearance:  Alert, cooperative, no distress, appears stated age   Head:  Normocephalic, without obvious abnormality, atraumatic   Eyes:  conjunctiva/corneas clear, EOM's intact       Nose: Nares normal, septum midline,mucosa normal, no drainage or sinus tenderness       Neck: Supple, symmetrical, trachea midline no JVD, no carotid bruit       Lungs:   Clear to auscultation bilaterally, respirations unlabored       Heart:  Regular rate  and rhythm, S1 and S2 normal, no murmur, rub, or gallop   Abdomen:   Soft, non-tender, non-distended       Extremities: Extremities normal, atraumatic, no cyanosis or edema       Skin: Skin color, texture, turgor normal, no rashes or lesions       Neurologic:    Psychiatric:   Alert, follows commands, no obvious motor deficits    Eye contact good.  Mood and affect normal.           Assessment and Plan  Patient Active Problem List   Diagnosis   . Dysthymia   . IUD (intrauterine device) in place   . Morbid obesity with BMI of 40.0-44.9, adult   . Morbidly obese   . Type 2 diabetes mellitus       We had a long discussion about her obesity and related disease burden.  Total time spent on this encounter on day of service was 60 minutes.  We reviewed her medical history and performed a physical exam, as well as discussed her obesity related disease burden as documented in the above diagnoses.  We discussed the impact of their obesity related disease burden on surgical risk as well as the likelihood of resolution  postoperatively with each procedure using the MBSAQIP risk assessment tool.  Routine screening laboratory tests will be obtained to assess unrecognized obesity related comorbidities.  We discussed the fact that she must be up to date on all age appropriate cancer screenings.  We discussed and reviewed the bariatric surgical procedures available.      She is considering a robotic assisted laparoscopic gastric bypass, robotic assisted laparoscopic sleeve gastrectomy or intra gastric balloon . We reviewed the indications for sleep study and need to treat at least two weeks prior to surgery if present., We discussed the fact that reflux symptoms can be exacerbated following sleeve gastrectomy., If the patient decides to have a sleeve gastrectomy, we reviewed the rationale for upper gi endoscopy to determine whether a hiatal hernia is present.  The patient understands that repair of the hiatal hernia may be done at the  time of surgery to prevent worsening of reflux symptoms., We reviewed the fact long term weight loss and improvements in diabetes are better with the duodenal switch or gastric bypass than the sleeve gastrectomy., We discussed the fact that nutritional complications are more common after the duodenal switch procedure and that compliance with vitamin and mineral therapy is critical.  We also discussed the fact that diarrhea is more common after the duodenal switch than with the other bariatric surgical procedures., We discussed the fact that their chronic pain symptoms may or may not be improved after bariatric surgery., We discussed the fact that they must weigh less than 450 pounds on the day of surgery., We reviewed the fact that alcohol abuse or dependence has been seen in some bariatric surgical patients, especially those who have a gastric bypass., We specifically discussed the fact that it is unknown what effects MJ use has on weight loss after baraitaric surgery or development of other complications, A copy of the MBSAQIP risk report was provided to the patient. We discussed the disease burden recognized to date and the rationale for the plan as outlined above.  All of her questions were answered.  The patient is still giving thoughts to the intragastric balloon vs SG VS RYGB , she has many questions about the intragastric balloon, she admitted as being not so good at taking pills and medicines on long term, that's why she prefers the idea of intra-gastric balloon. All her questions were answered to her satisfactions, she will let us know which way she would like to go once she makes up her mind.         She will follow up with me in the office after completion of studies and any insurance mandated diet.    Signed,  Mordecai Maes, MD

## 2020-05-04 ENCOUNTER — Encounter (INDEPENDENT_AMBULATORY_CARE_PROVIDER_SITE_OTHER): Payer: Self-pay

## 2020-05-04 NOTE — Progress Notes (Signed)
Bariatric Surgery Checklist    Surgeon: Sadi  Surgery: Undecided     FASTING LABS -   CMP  CBC  A1C  TSH  Lipids  Ferritin  Vit. D  PTH  Vit B12  Folate    H. PYLORI  SLEEP REFERRAL  CHEST X-RAY  EKG  PAP    6 MO DIET  LES FEE  LES BEHAVIOR  LES EXERCISE  LES NUTRITION  NUTRITION CLEARANCE  BEHAVIORAL CLEARANCE

## 2020-06-11 ENCOUNTER — Encounter (RURAL_HEALTH_CENTER): Payer: Self-pay

## 2020-07-06 ENCOUNTER — Encounter (RURAL_HEALTH_CENTER): Payer: Self-pay

## 2020-07-29 ENCOUNTER — Ambulatory Visit: Payer: No Typology Code available for payment source

## 2020-08-05 ENCOUNTER — Telehealth (RURAL_HEALTH_CENTER): Payer: No Typology Code available for payment source | Admitting: Medical

## 2020-08-12 ENCOUNTER — Encounter (RURAL_HEALTH_CENTER): Payer: No Typology Code available for payment source | Admitting: Medical

## 2020-08-19 ENCOUNTER — Ambulatory Visit (RURAL_HEALTH_CENTER): Payer: No Typology Code available for payment source | Admitting: Physician Assistant

## 2020-08-19 ENCOUNTER — Encounter (RURAL_HEALTH_CENTER): Payer: Self-pay | Admitting: Physician Assistant

## 2020-08-19 VITALS — BP 111/75 | HR 90 | Temp 97.6°F | Resp 20 | Ht 68.0 in | Wt 278.0 lb

## 2020-08-19 DIAGNOSIS — Z0000 Encounter for general adult medical examination without abnormal findings: Secondary | ICD-10-CM

## 2020-08-19 DIAGNOSIS — Z13220 Encounter for screening for lipoid disorders: Secondary | ICD-10-CM

## 2020-08-19 DIAGNOSIS — Z6841 Body Mass Index (BMI) 40.0 and over, adult: Secondary | ICD-10-CM

## 2020-08-19 DIAGNOSIS — Z1329 Encounter for screening for other suspected endocrine disorder: Secondary | ICD-10-CM

## 2020-08-19 DIAGNOSIS — G8929 Other chronic pain: Secondary | ICD-10-CM

## 2020-08-19 DIAGNOSIS — E119 Type 2 diabetes mellitus without complications: Secondary | ICD-10-CM

## 2020-08-19 DIAGNOSIS — Z1231 Encounter for screening mammogram for malignant neoplasm of breast: Secondary | ICD-10-CM

## 2020-08-19 DIAGNOSIS — M545 Low back pain: Secondary | ICD-10-CM

## 2020-08-19 MED ORDER — CYCLOBENZAPRINE HCL 5 MG PO TABS
5.0000 mg | ORAL_TABLET | Freq: Three times a day (TID) | ORAL | 0 refills | Status: DC | PRN
Start: 2020-08-19 — End: 2020-10-17

## 2020-08-19 NOTE — Progress Notes (Signed)
Chief Complaint   Patient presents with    Medication Refill     Back spasms; Bariatric surgery;         SUBJECTIVE:  HISTORY OF PRESENT ILLNESS:    39 y.o. female presents to establish care and for annual physical. She reports history of low back pain with severe pain, spasms, and sciatica at times. Responded to muscle relaxers. Doing well currently.     Previously saw another provider in this clinic.  Provider has left the practice.    She was prescribed Metformin last year due to findings of diabetes type 2.  Patient experienced abdominal pain with the medication and has not taken anything else for treatment of diabetes.    Struggles with weight and has had no real sustained response to diet and exercise interventions. She states that she is taking phentermine and that has been helpful with weight loss. She is planning for bariatric surgery later this year with Dr. Nuala Alpha.     Health Maintenance:  Immunization History   Administered Date(s) Administered    COVID-19 Ad26 Vaccine Preservative Free 0.5 mL (JANSSEN) 02/29/2020    Influenza (Im) Preserved TRIVALENT VACCINE 01/18/2017, 09/13/2018    Influenza quad 6 MOS to 64 YRS (Flulaval/Fluarix) 09/05/2019    Influenza quadrivalent (IM) 6 months & up PRESERVED 01/18/2017, 09/13/2018     Last labs: ordered 04/18/20 by Dr. Denyse Amass. Last performed was 12/20.  Colonoscopy: in early 20s, no colon cancer in the family  Mammo: late 53s  LMP: 2009. Mirena in place.  PAP: 2 years ago. Denies abnormal paps in the past. Sees GYN.    Diet: following with bariatrics  Exercise: no programmed activity  Water: 3 34oz bottles daily  Caffeine: 2 beverages per week  Sleep: does not have trouble falling asleep or staying asleep, average of 7-9 hours nightly.    Social:  Lives with: spouse and three daughters as well as mother-in-law. Safe and comfortable in home environment.   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   HENT: Negative for congestion, ear pain, rhinorrhea, sinus pressure, sinus pain, sneezing, sore throat and trouble swallowing.    Eyes: Negative for redness.   Respiratory: Positive for chest tightness (periodically, not worsened with exercise). Negative for cough, shortness of breath and wheezing.    Cardiovascular: Negative for chest pain and palpitations.   Gastrointestinal: Negative for abdominal distention, diarrhea, nausea and vomiting.   Endocrine: Negative for polydipsia and polyuria.   Genitourinary: Negative for difficulty urinating.   Musculoskeletal: Positive for arthralgias (shoulders bilaterally when moving frequently) and back pain (with sciatica on right). Negative for joint swelling.   Skin: Negative for rash.   Allergic/Immunologic: Negative for immunocompromised state.   Neurological: Negative for dizziness, syncope and light-headedness.   Hematological: Negative for adenopathy.   Psychiatric/Behavioral: Negative for agitation, self-injury and suicidal ideas.           Past Medical History:   Diagnosis Date    Anxiety 12/11/2017    Dysthymia 12/11/2017    Irritable bowel syndrome 04/1999    Migraine 10/2004    Morbid obesity with BMI of 40.0-44.9, adult 02/11/2017    No known health problems     Type 2 diabetes mellitus 12/06/2017    HgA1C 6.5 % on 12/05/2017     Current Outpatient  Medications on File Prior to Visit   Medication Sig Dispense Refill    levonorgestrel (MIRENA) 20 MCG/24HR IUD 1 each by Intrauterine route once       No current facility-administered medications on file prior to visit.      Allergies   Allergen Reactions    Amoxicillin Rash     Past Surgical History:   Procedure Laterality Date    CHOLECYSTECTOMY  10/2005    NO PAST SURGERIES       Social History     Tobacco Use    Smoking status: Never Smoker    Smokeless tobacco: Never Used     Tobacco comment: Never used   Vaping Use    Vaping Use: Never used   Substance Use Topics    Alcohol use: Yes     Alcohol/week: 2.0 standard drinks     Types: 1 Cans of beer, 1 Standard drinks or equivalent per week     Comment: social    Drug use: Never     Family History   Problem Relation Age of Onset    Depression Mother     Celiac disease Mother     Diabetes Father     Crohn's disease Maternal Grandmother     Heart disease Maternal Grandfather     Heart attack Paternal Grandfather      Body mass index is 42.27 kg/m.    OBJECTIVE:   BP 111/75 (BP Site: Left arm, Patient Position: Sitting, Cuff Size: Large)    Pulse 90    Temp 97.6 F (36.4 C) (Temporal)    Resp 20    Ht 1.727 m (5\' 8" )    Wt 126.1 kg (278 lb)    SpO2 98%    BMI 42.27 kg/m   Physical Exam  Vitals and nursing note reviewed.   Constitutional:       General: She is not in acute distress.     Appearance: She is obese. She is not ill-appearing, toxic-appearing or diaphoretic.   HENT:      Head: Normocephalic and atraumatic.      Right Ear: Tympanic membrane, ear canal and external ear normal. There is no impacted cerumen.      Left Ear: Tympanic membrane, ear canal and external ear normal. There is no impacted cerumen.      Nose: Nose normal. No congestion or rhinorrhea.      Mouth/Throat:      Mouth: Mucous membranes are moist.      Pharynx: Oropharynx is clear. No oropharyngeal exudate or posterior oropharyngeal erythema.   Eyes:      General: No scleral icterus.        Right eye: No discharge.         Left eye: No discharge.      Conjunctiva/sclera: Conjunctivae normal.      Pupils: Pupils are equal, round, and reactive to light.   Cardiovascular:      Rate and Rhythm: Normal rate and regular rhythm.      Heart sounds: No murmur heard.   No friction rub. No gallop.    Pulmonary:      Effort: Pulmonary effort is normal. No respiratory distress.      Breath sounds: Normal breath sounds. No stridor. No wheezing, rhonchi or rales.    Abdominal:      General: Abdomen is flat. Bowel sounds are normal. There is no distension.      Palpations: There is no mass.  Tenderness: There is no abdominal tenderness. There is no guarding or rebound.      Hernia: No hernia is present.   Musculoskeletal:         General: No swelling or deformity.      Cervical back: No rigidity.   Lymphadenopathy:      Cervical: No cervical adenopathy.   Skin:     General: Skin is warm and dry.      Coloration: Skin is not jaundiced or pale.   Neurological:      General: No focal deficit present.      Mental Status: She is alert and oriented to person, place, and time.   Psychiatric:         Mood and Affect: Mood normal.         Behavior: Behavior normal.         Thought Content: Thought content normal.         Judgment: Judgment normal.        ASSESSMENT/PLAN:    39 year old female with:    1. Morbid obesity  Discussed diet, exercise, medication, and surgical intervention for weight loss. Dietary calorie restriction to 1800kcal per day. Exercise 30 minutes moderate intensity cardio per day. Referral placed to bariatric/metabolic clinic.  - Ambulatory referral to Bariatric and Metabolic Program    2. Type 2 diabetes mellitus without complication, without long-term current use of insulin  Diet controlled T2DM Hgb A1c 6.5% in Jan 2019. Due for repeat hgb A1c, urine albumin screening, and CMP to assess baseline renal function and LFT.  - VH Microalbumin/Creatinine Ratio; Future  - Hemoglobin A1C; Future  - Comprehensive metabolic panel; Future    3. Screening, lipid  - Lipid panel; Future    4. Screening for thyroid disorder  - TSH; Future    5. Encounter to establish care with new doctor    6. Chronic low back pain, unspecified back pain laterality, unspecified whether sciatica present  Stable at this time. Discussed core stretch, stabilizing, and strengthening with gentle yoga. Follow up PRN. Can trial flexeril if needed. Do not drive or operate heavy equipment with this  medication.    7. Preventative health care  -Healthy diet, regular physical activity, reviewed preventative healthcare maintenance topics.    -Follow up with PCP within 3 months.  -RTC if worsening or not resolving as expected. ED with any concerning symptoms. Questions answered in full. Patient/guardian are in agreement with the plan.

## 2020-08-27 ENCOUNTER — Ambulatory Visit
Admission: RE | Admit: 2020-08-27 | Discharge: 2020-08-27 | Disposition: A | Discharge status: Home / Self Care | Payer: No Typology Code available for payment source | Source: Ambulatory Visit | Attending: Physician Assistant | Admitting: Physician Assistant

## 2020-08-27 ENCOUNTER — Inpatient Hospital Stay: Admission: RE | Admit: 2020-08-27 | Payer: Self-pay | Source: Ambulatory Visit

## 2020-08-27 DIAGNOSIS — M545 Low back pain, unspecified: Secondary | ICD-10-CM | POA: Insufficient documentation

## 2020-08-27 DIAGNOSIS — Z6841 Body Mass Index (BMI) 40.0 and over, adult: Secondary | ICD-10-CM | POA: Insufficient documentation

## 2020-08-27 DIAGNOSIS — Z0000 Encounter for general adult medical examination without abnormal findings: Secondary | ICD-10-CM | POA: Insufficient documentation

## 2020-08-27 DIAGNOSIS — E119 Type 2 diabetes mellitus without complications: Secondary | ICD-10-CM | POA: Insufficient documentation

## 2020-08-27 DIAGNOSIS — G8929 Other chronic pain: Secondary | ICD-10-CM | POA: Insufficient documentation

## 2020-08-27 DIAGNOSIS — Z13220 Encounter for screening for lipoid disorders: Secondary | ICD-10-CM | POA: Insufficient documentation

## 2020-08-27 DIAGNOSIS — Z1329 Encounter for screening for other suspected endocrine disorder: Secondary | ICD-10-CM | POA: Insufficient documentation

## 2020-08-27 LAB — CBC AND DIFFERENTIAL
Basophils %: 0.1 % (ref 0.0–3.0)
Basophils Absolute: 0 10*3/uL (ref 0.0–0.3)
Eosinophils %: 2 % (ref 0.0–7.0)
Eosinophils Absolute: 0.1 10*3/uL (ref 0.0–0.8)
Hematocrit: 47 % (ref 36.0–48.0)
Hemoglobin: 15.3 gm/dL (ref 12.0–16.0)
Lymphocytes Absolute: 1.5 10*3/uL (ref 0.6–5.1)
Lymphocytes: 22.1 % (ref 15.0–46.0)
MCH: 30 pg (ref 28–35)
MCHC: 32 gm/dL (ref 32–36)
MCV: 93 fL (ref 80–100)
MPV: 8 fL (ref 6.0–10.0)
Monocytes Absolute: 0.4 10*3/uL (ref 0.1–1.7)
Monocytes: 6.2 % (ref 3.0–15.0)
Neutrophils %: 69.5 % (ref 42.0–78.0)
Neutrophils Absolute: 4.8 10*3/uL (ref 1.7–8.6)
PLT CT: 293 10*3/uL (ref 130–440)
RBC: 5.03 10*6/uL — ABNORMAL HIGH (ref 3.80–5.00)
RDW: 12.6 % (ref 11.0–14.0)
WBC: 6.9 10*3/uL (ref 4.0–11.0)

## 2020-08-27 LAB — COMPREHENSIVE METABOLIC PANEL
ALT: 31 U/L (ref 0–55)
AST (SGOT): 22 U/L (ref 10–42)
Albumin/Globulin Ratio: 0.94 Ratio (ref 0.80–2.00)
Albumin: 3.4 gm/dL — ABNORMAL LOW (ref 3.5–5.0)
Alkaline Phosphatase: 55 U/L (ref 40–145)
Anion Gap: 17.5 mMol/L (ref 7.0–18.0)
BUN / Creatinine Ratio: 14.8 Ratio (ref 10.0–30.0)
BUN: 12 mg/dL (ref 7–22)
Bilirubin, Total: 0.8 mg/dL (ref 0.1–1.2)
CO2: 21 mMol/L (ref 20–30)
Calcium: 9.7 mg/dL (ref 8.5–10.5)
Chloride: 106 mMol/L (ref 98–110)
Creatinine: 0.81 mg/dL (ref 0.60–1.20)
EGFR: 91 mL/min/{1.73_m2} (ref 60–150)
Globulin: 3.6 gm/dL (ref 2.0–4.0)
Glucose: 192 mg/dL — ABNORMAL HIGH (ref 71–99)
Osmolality Calculated: 284 mOsm/kg (ref 275–300)
Potassium: 4.5 mMol/L (ref 3.5–5.3)
Protein, Total: 7 gm/dL (ref 6.0–8.3)
Sodium: 140 mMol/L (ref 136–147)

## 2020-08-27 LAB — LIPID PANEL
Cholesterol: 146 mg/dL (ref 75–199)
Coronary Heart Disease Risk: 3.84
HDL: 38 mg/dL — ABNORMAL LOW (ref 45–65)
LDL Calculated: 92 mg/dL
Triglycerides: 81 mg/dL (ref 10–150)
VLDL: 16 (ref 0–40)

## 2020-08-27 LAB — THYROID STIMULATING HORMONE (TSH), REFLEX ON ABNORMAL TO FREE T4, SERUM: TSH: 1.34 u[IU]/mL (ref 0.40–4.20)

## 2020-08-27 LAB — HEMOGLOBIN A1C: Hgb A1C, %: 8.2 %

## 2020-09-03 ENCOUNTER — Ambulatory Visit (RURAL_HEALTH_CENTER): Payer: No Typology Code available for payment source

## 2020-09-22 ENCOUNTER — Encounter (RURAL_HEALTH_CENTER): Payer: Self-pay

## 2020-10-17 ENCOUNTER — Ambulatory Visit
Admission: RE | Admit: 2020-10-17 | Discharge: 2020-10-17 | Disposition: A | Discharge status: Home / Self Care | Payer: No Typology Code available for payment source | Source: Ambulatory Visit | Attending: Surgery | Admitting: Surgery

## 2020-10-17 ENCOUNTER — Ambulatory Visit (RURAL_HEALTH_CENTER): Payer: No Typology Code available for payment source | Admitting: Physician Assistant

## 2020-10-17 ENCOUNTER — Encounter: Payer: Self-pay | Admitting: Surgery

## 2020-10-17 ENCOUNTER — Ambulatory Visit (HOSPITAL_BASED_OUTPATIENT_CLINIC_OR_DEPARTMENT_OTHER)
Admission: RE | Admit: 2020-10-17 | Discharge: 2020-10-17 | Disposition: A | Discharge status: Home / Self Care | Payer: No Typology Code available for payment source | Source: Ambulatory Visit

## 2020-10-17 ENCOUNTER — Inpatient Hospital Stay: Admission: RE | Admit: 2020-10-17 | Payer: Self-pay | Source: Ambulatory Visit

## 2020-10-17 ENCOUNTER — Encounter (RURAL_HEALTH_CENTER): Payer: Self-pay | Admitting: Physician Assistant

## 2020-10-17 VITALS — BP 128/78 | HR 98 | Temp 97.9°F | Resp 19 | Ht 68.0 in | Wt 266.8 lb

## 2020-10-17 DIAGNOSIS — Z01818 Encounter for other preprocedural examination: Secondary | ICD-10-CM

## 2020-10-17 DIAGNOSIS — E119 Type 2 diabetes mellitus without complications: Secondary | ICD-10-CM

## 2020-10-17 DIAGNOSIS — Z6841 Body Mass Index (BMI) 40.0 and over, adult: Secondary | ICD-10-CM

## 2020-10-17 LAB — LIPID PANEL
Cholesterol: 160 mg/dL (ref 75–199)
Coronary Heart Disease Risk: 4.21
HDL: 38 mg/dL — ABNORMAL LOW (ref 45–65)
LDL Calculated: 81 mg/dL
Triglycerides: 203 mg/dL — ABNORMAL HIGH (ref 10–150)
VLDL: 41 — ABNORMAL HIGH (ref 0–40)

## 2020-10-17 LAB — CBC AND DIFFERENTIAL
Basophils %: 0.6 % (ref 0.0–3.0)
Basophils Absolute: 0 10*3/uL (ref 0.0–0.3)
Eosinophils %: 2.7 % (ref 0.0–7.0)
Eosinophils Absolute: 0.2 10*3/uL (ref 0.0–0.8)
Hematocrit: 48.8 % — ABNORMAL HIGH (ref 36.0–48.0)
Hemoglobin: 15.5 gm/dL (ref 12.0–16.0)
Lymphocytes Absolute: 1.6 10*3/uL (ref 0.6–5.1)
Lymphocytes: 21.8 % (ref 15.0–46.0)
MCH: 30 pg (ref 28–35)
MCHC: 32 gm/dL (ref 32–36)
MCV: 93 fL (ref 80–100)
MPV: 7.8 fL (ref 6.0–10.0)
Monocytes Absolute: 0.5 10*3/uL (ref 0.1–1.7)
Monocytes: 6.9 % (ref 3.0–15.0)
Neutrophils %: 68.1 % (ref 42.0–78.0)
Neutrophils Absolute: 5.1 10*3/uL (ref 1.7–8.6)
PLT CT: 322 10*3/uL (ref 130–440)
RBC: 5.26 10*6/uL — ABNORMAL HIGH (ref 3.80–5.00)
RDW: 12.5 % (ref 11.0–14.0)
WBC: 7.5 10*3/uL (ref 4.0–11.0)

## 2020-10-17 LAB — IRON PROFILE
% Saturation: 26 % (ref 15–50)
Iron: 94 ug/dL (ref 40.0–160.0)
TIBC: 357 ug/dL (ref 250–450)
Transferrin: 255 mg/dL (ref 180.0–382.0)

## 2020-10-17 LAB — COMPREHENSIVE METABOLIC PANEL
ALT: 29 U/L (ref 0–55)
AST (SGOT): 21 U/L (ref 10–42)
Albumin/Globulin Ratio: 1.09 Ratio (ref 0.80–2.00)
Albumin: 3.8 gm/dL (ref 3.5–5.0)
Alkaline Phosphatase: 64 U/L (ref 40–145)
Anion Gap: 14.3 mMol/L (ref 7.0–18.0)
BUN / Creatinine Ratio: 11.9 Ratio (ref 10.0–30.0)
BUN: 10 mg/dL (ref 7–22)
Bilirubin, Total: 1 mg/dL (ref 0.1–1.2)
CO2: 23 mMol/L (ref 20–30)
Calcium: 9.8 mg/dL (ref 8.5–10.5)
Chloride: 106 mMol/L (ref 98–110)
Creatinine: 0.84 mg/dL (ref 0.60–1.20)
EGFR: 87 mL/min/{1.73_m2} (ref 60–150)
Globulin: 3.5 gm/dL (ref 2.0–4.0)
Glucose: 154 mg/dL — ABNORMAL HIGH (ref 71–99)
Osmolality Calculated: 280 mOsm/kg (ref 275–300)
Potassium: 4.3 mMol/L (ref 3.5–5.3)
Protein, Total: 7.3 gm/dL (ref 6.0–8.3)
Sodium: 139 mMol/L (ref 136–147)

## 2020-10-17 LAB — VITAMIN B12: Vitamin B-12: 508 pg/mL (ref 213–816)

## 2020-10-17 LAB — HEMOGLOBIN A1C: Hgb A1C, %: 7.4 %

## 2020-10-17 LAB — MAGNESIUM: Magnesium: 2.1 mg/dL (ref 1.6–2.6)

## 2020-10-17 LAB — PHOSPHORUS: Phosphorus: 4.1 mg/dL (ref 2.3–4.7)

## 2020-10-17 NOTE — Progress Notes (Signed)
Subjective:      Cynthia Rojas is a 39 y.o. female who presents to the office today for a preoperative consultation at the request of surgeon Dr. Nuala Alpha with [facility] who plans on performing gastric sleeve on December 20. This consultation is requested for the specific conditions prompting preoperative evaluation because of potential affect on operative risk. Planned anesthesia: general. The patient has the following known anesthesia issues: none. Patients bleeding risk: no recent abnormal bleeding, no remote history of abnormal bleeding and no use of Ca-channel blockers. Patient does not have objections to receiving blood products if needed.    The following portions of the patient's history were reviewed and updated as appropriate: allergies, current medications, past family history, past medical history, past social history, past surgical history and problem list.     Allergies   Allergen Reactions    Amoxicillin Rash     Current Outpatient Medications on File Prior to Visit   Medication Sig Dispense Refill    levonorgestrel (MIRENA) 20 MCG/24HR IUD 1 each by Intrauterine route once      phentermine 15 MG capsule       [DISCONTINUED] cyclobenzaprine (FLEXERIL) 5 MG tablet Take 1 tablet (5 mg total) by mouth 3 (three) times daily as needed for Muscle spasms Please take 1-2 tabs TID prn 10 tablet 0     No current facility-administered medications on file prior to visit.     Family History   Problem Relation Age of Onset    Depression Mother     Celiac disease Mother     Diabetes Father     Crohn's disease Maternal Grandmother     Heart disease Maternal Grandfather     Heart attack Paternal Grandfather      Past Medical History:   Diagnosis Date    Anxiety 12/11/2017    Dysthymia 12/11/2017    Irritable bowel syndrome 04/1999    Migraine 10/2004    Morbid obesity with BMI of 40.0-44.9, adult 02/11/2017    Type 2 diabetes mellitus 12/06/2017    HgA1C 6.5 % on 12/05/2017      Social History     Socioeconomic History    Marital status: Married     Spouse name: Not on file    Number of children: Not on file    Years of education: Not on file    Highest education level: Not on file   Occupational History    Not on file   Tobacco Use    Smoking status: Never Smoker    Smokeless tobacco: Never Used    Tobacco comment: Never used   Vaping Use    Vaping Use: Never used   Substance and Sexual Activity    Alcohol use: Yes     Alcohol/week: 2.0 standard drinks     Types: 1 Cans of beer, 1 Standard drinks or equivalent per week     Comment: social    Drug use: Never    Sexual activity: Yes     Partners: Male     Birth control/protection: I.U.D.   Other Topics Concern    Dietary supplements / vitamins Yes     Comment: Eleve8, Acceler8    Anesthesia problems No    Blood thinners No    Pregnant No    Future Children No    Number of Pregnancies? Yes     Comment: 6    Number of children Yes     Comment: 3  Miscarriages / Abortions? Yes     Comment: 3    Diet Center No    Doylene Bode No    [place] Weight Loss Yes    Nutri-System No    Opti-Fast / Medi-Fast No    Overeaters Anonymous No    [facility] No    TOPS No    Weight Watchers No    Atkins No    Binging / Purging No    Calorie Counting No    Fasting No    High Protein Yes    Low Carb Yes    Low Fat No    [facility] Diet No    Slim Fast Yes    [address] Yes    Stationary cycle or treadmill Yes    Gym/fitness Classes Yes    Home exercise/video Yes    Swimming Yes    Weight training Yes    Walking or running Yes    Acutrim No    [place] No    Contrave No    Dexatrim No    Diethylpropion No    Fastin No    Fen - Phen No    Ionamin / Adipex No    Phentermine Yes    Qsymia No    Prozac No    Saxenda No    Topamax Yes    Wellbutrin No    Xenical (Orlistat, Alli) No    Other Med Yes     Comment: diet supplements don't recall names   Social History Narrative    Not on file      Social Determinants of Health     Financial Resource Strain:     Difficulty of Paying Living Expenses: Not on file   Food Insecurity:     Worried About Programme researcher, broadcasting/film/video in the Last Year: Not on file    [employer] of Food in the Last Year: Not on file   Transportation Needs:     Lack of Transportation (Medical): Not on file    Lack of Transportation (Non-Medical): Not on file   Physical Activity:     Days of Exercise per Week: Not on file    Minutes of Exercise per Session: Not on file   Stress:     Feeling of Stress : Not on file   Social Connections:     Frequency of Communication with Friends and Family: Not on file    Frequency of Social Gatherings with Friends and Family: Not on file    Attends Religious Services: Not on file    Active Member of Clubs or Organizations: Not on file    Attends Banker Meetings: Not on file    Marital Status: Not on file   Intimate Partner Violence:     Fear of Current or Ex-Partner: Not on file    Emotionally Abused: Not on file    Physically Abused: Not on file    Sexually Abused: Not on file   Housing Stability:     Unable to Pay for Housing in the Last Year: Not on file    Number of Places Lived in the Last Year: Not on file    Unstable Housing in the Last Year: Not on file     Past Surgical History:   Procedure Laterality Date    CHOLECYSTECTOMY  10/2005     Patient Active Problem List   Diagnosis    Dysthymia    IUD (intrauterine device) in  place    Type 2 diabetes mellitus    Morbid obesity with body mass index (BMI) of 40.0 to 49.9    Migraine    Anxiety    Depression     Review of Systems  Review of Systems   Constitutional: Negative for chills, diaphoresis, fever and malaise/fatigue.   HENT: Negative for congestion, nosebleeds and sore throat.    Eyes: Negative for double vision and redness.   Respiratory: Negative for cough, sputum production, shortness of breath and wheezing.    Cardiovascular: Negative for chest pain,  palpitations and leg swelling.   Gastrointestinal: Negative for abdominal pain, blood in stool, constipation, diarrhea, heartburn, melena, nausea and vomiting.   Genitourinary: Negative for dysuria and hematuria.   Musculoskeletal: Positive for back pain (chronic). Negative for falls.   Skin: Negative for itching and rash.   Neurological: Negative for dizziness, tingling, tremors, sensory change, focal weakness, seizures, loss of consciousness, weakness and headaches.   Endo/Heme/Allergies: Negative for polydipsia. Does not bruise/bleed easily.   Psychiatric/Behavioral: Negative for depression, hallucinations, memory loss, substance abuse and suicidal ideas. The patient is not nervous/anxious and does not have insomnia.       Objective:  Visit Vitals  BP 128/78 (BP Site: Right arm, Patient Position: Sitting, Cuff Size: Large)   Pulse 98   Temp 97.9 F (36.6 C) (Temporal)   Resp 19   Ht 1.727 m (5\' 8" )   Wt 121 kg (266 lb 12.8 oz)   SpO2 98%   BMI 40.57 kg/m           BP 128/78 (BP Site: Right arm, Patient Position: Sitting, Cuff Size: Large)    Pulse 98    Temp 97.9 F (36.6 C) (Temporal)    Resp 19    Ht 1.727 m (5\' 8" )    Wt 121 kg (266 lb 12.8 oz)    SpO2 98%    BMI 40.57 kg/m     General Appearance:    Alert, cooperative, no distress, appears stated age   Head:    Normocephalic, without obvious abnormality, atraumatic   Eyes:    PERRL, conjunctiva/corneas clear, EOM's intact, fundi     benign, both eyes   Ears:    Normal TM's and external ear canals, both ears   Nose:   Nares normal, septum midline, mucosa normal, no drainage    or sinus tenderness   Throat:   Lips, mucosa, and tongue normal; teeth and gums normal   Neck:   Supple, symmetrical, trachea midline, no adenopathy;     thyroid:  no enlargement/tenderness/nodules; no carotid    bruit or JVD   Back:     Symmetric, no curvature, ROM normal, no CVA tenderness   Lungs:     Clear to auscultation bilaterally, respirations unlabored   Chest Wall:    No  tenderness or deformity    Heart:    Regular rate and rhythm, S1 and S2 normal, no murmur, rub   or gallop   Abdomen:     Soft, non-tender, bowel sounds active all four quadrants,     no masses, no organomegaly   Rectal:    Normal tone, normal prostate, no masses or tenderness;    guaiac negative stool   Extremities:   Extremities normal, atraumatic, no cyanosis or edema   Pulses:   2+ and symmetric all extremities   Skin:   Skin color, texture, turgor normal, no rashes or lesions   Lymph nodes:   Cervical,  supraclavicular, and axillary nodes normal   Neurologic:   CNII-XII intact, normal strength, sensation and reflexes     throughout     Predictors of intubation difficulty:   Morbid obesity? yes - BMI 40.57   Anatomically abnormal facies? no   Prominent incisors? no   Receding mandible? no   Short, thick neck? no   Neck range of motion: normal   Mallampati score: I (soft palate, uvula, fauces, and tonsillar pillars visible)   Thyromental distance: > 6cm (8)   Mouth opening: 5.5 cm   Dentition: No chipped, loose, or missing teeth.    Cardiographics  ECG: Normal sinus rhythm, rate 73    Lab Review   Component      Latest Ref Rng & Units 10/17/2020   WBC      4.0 - 11.0 K/cmm 7.5   RBC      3.80 - 5.00 M/cmm 5.26 (H)   Hemoglobin      12.0 - 16.0 gm/dL 16.1   Hematocrit      36.0 - 48.0 % 48.8 (H)   MCV      80 - 100 fL 93   MCH      28 - 35 pg 30   MCHC      32 - 36 gm/dL 32   RDW      09.6 - 04.5 % 12.5   Platelet Count      130 - 440 K/cmm 322   MPV      6.0 - 10.0 fL 7.8   Neutrophils %      42.0 - 78.0 % 68.1   Lymphocytes      15.0 - 46.0 % 21.8   Monocytes      3.0 - 15.0 % 6.9   Eosinophils %      0.0 - 7.0 % 2.7   Basophils Automated      0.0 - 3.0 % 0.6   Neutrophils # POCT      1.7 - 8.6 K/cmm 5.1   Lymphocytes Absolute      0.6 - 5.1 K/cmm 1.6   Monocytes Absolute      0.1 - 1.7 K/cmm 0.5   Eosinophils Absolute      0.0 - 0.8 K/cmm 0.2   Basophils Absolute      0.0 - 0.3 K/cmm 0.0   Sodium      136 - 147  mMol/L 139   Potassium      3.5 - 5.3 mMol/L 4.3   Chloride      98 - 110 mMol/L 106   CO2      20 - 30 mMol/L 23   Calcium      8.5 - 10.5 mg/dL 9.8   Glucose      71 - 99 mg/dL 409 (H)   Creatinine      0.60 - 1.20 mg/dL 8.11   BUN      7 - 22 mg/dL 10   Protein Total      6.0 - 8.3 gm/dL 7.3   Albumin      3.5 - 5.0 gm/dL 3.8   Alkaline Phosphatase      40 - 145 U/L 64   ALT      0 - 55 U/L 29   AST      10 - 42 U/L 21   Bilirubin Total      0.1 - 1.2 mg/dL 1.0   Albumin/Globulin Ratio  0.80 - 2.00 Ratio 1.09   Anion Gap      7.0 - 18.0 mMol/L 14.3   BUN / Creatinine Ratio      10.0 - 30.0 Ratio 11.9   EGFR      60 - 150 mL/min/1.57m2 87   Osmolality Calculated      275 - 300 mOsm/kg 280   Globulin      2.0 - 4.0 gm/dL 3.5   Patient Fasting       Fasting   Cholesterol      75 - 199 mg/dL 324   Triglycerides      10 - 150 mg/dL 401 (H)   HDL      45 - 65 mg/dL 38 (L)   LDL Calculated      mg/dL 81   Coronary Heart Disease Risk       4.21   VLDL      0 - 40 41 (H)   Iron      40.0 - 160.0 mcg/dL 02.7   Transferrin      180.0 - 382.0 mg/dL 253.6   TIBC      644 - 450 ug/dL 034   Iron % Sat      15 - 50 % 26   Magnesium      1.6 - 2.6 mg/dL 2.1   Phosphorus      2.3 - 4.7 mg/dL 4.1   Hgb V4Q, %      % 7.4   Vit D, 25-Hydroxy      30.0 - 80.0 ng/mL 23.4 (L)   Vitamin B-12      213 - 816 pg/mL 508        Assessment:        39 y.o. female with planned surgery as above.    Known risk factors for perioperative complications: Diabetes mellitus    Difficulty with intubation is not anticipated.    Cardiac Risk Estimation: 1 point Class II risk 6.0% 30 day risk of death, MI, or cardiac arrest    Current medications which may produce withdrawal symptoms if withheld perioperatively: NA       Plan:      1. Preoperative workup as follows EKG, CBC with diff, CMP, Mg, Phos, Lipid (nonfasting), FE, TIBC, HGB A1C, Serum B12, Vit D25, RBC folate.  2. Change in medication regimen before surgery: discontinue phentermine - contact  bariatric surgery team about timing before surgery.  3. Prophylaxis for cardiac events with perioperative beta-blockers: should be considered, specific regimen per anesthesia.  4. Invasive hemodynamic monitoring perioperatively: at the discretion of anesthesiologist.  5. Deep vein thrombosis prophylaxis postoperatively:regimen to be chosen by surgical team.  6. Surveillance for postoperative MI with ECG immediately postoperatively and on postoperative days 1 and 2 AND troponin levels 24 hours postoperatively and on day 4 or hospital discharge (whichever comes first): at the discretion of anesthesiologist.  7. Other measures: Preoperative alcohol abstention x 30 days.  Careful attention to perioperative glycemic control (pt stopped metformin due to GI upset Nov 2020, discussed that we may need to improve glycemic control prior to surgery based on labs).    8. A1C elevated at 7.4%. Pt doesn't wish to start medication at this time prior to surgery. She is encouraged reach out to our office if surgeon needs A1C closer to target prior to surgery and meds are necessary. Discussed target sugars are <120 mg/dL fasting and <595 mg/dL 2 hours after eating and at bedtime.  9.  Obesity     -Follow up with PCP after procedure, sooner if needed.  -RTC if worsening or not resolving as expected. ED with any concerning symptoms. Questions answered in full. Patient/guardian are in agreement with the plan.    Elmer Picker, PA-C   October 17, 2020    Medically cleared for surgery with exception of A1C of 7.4%. Encouraged improved glycemic control prior to surgery, but this is at the discretion of the surgical team.    *45 minutes spent on patient encounter on date of service.

## 2020-10-18 LAB — ECG 12-LEAD
P Wave Axis: 10 deg
P-R Interval: 164 ms
Patient Age: 38 years
Q-T Interval(Corrected): 436 ms
Q-T Interval: 395 ms
QRS Axis: 10 deg
QRS Duration: 90 ms
T Axis: 36 years
Ventricular Rate: 73 //min

## 2020-10-18 LAB — VITAMIN D,25 OH,TOTAL: Vitamin D 25-Hydroxy: 23.4 ng/mL — ABNORMAL LOW (ref 30.0–80.0)

## 2020-10-19 LAB — VH MISCELLANEOUS TEST: Test code and name of test: 467

## 2020-11-30 ENCOUNTER — Ambulatory Visit
Admission: RE | Admit: 2020-11-30 | Discharge: 2020-11-30 | Disposition: A | Discharge status: Home / Self Care | Payer: No Typology Code available for payment source | Source: Ambulatory Visit | Attending: Physician Assistant | Admitting: Physician Assistant

## 2020-11-30 DIAGNOSIS — Z1231 Encounter for screening mammogram for malignant neoplasm of breast: Secondary | ICD-10-CM | POA: Insufficient documentation

## 2021-01-27 ENCOUNTER — Ambulatory Visit (INDEPENDENT_AMBULATORY_CARE_PROVIDER_SITE_OTHER): Payer: Self-pay | Admitting: Obstetrics & Gynecology

## 2021-02-11 ENCOUNTER — Inpatient Hospital Stay: Admission: RE | Admit: 2021-02-11 | Payer: Self-pay | Source: Ambulatory Visit

## 2021-02-11 ENCOUNTER — Ambulatory Visit
Admission: RE | Admit: 2021-02-11 | Discharge: 2021-02-11 | Disposition: A | Discharge status: Home / Self Care | Payer: No Typology Code available for payment source | Source: Ambulatory Visit | Attending: Surgery | Admitting: Surgery

## 2021-02-11 DIAGNOSIS — Z9884 Bariatric surgery status: Secondary | ICD-10-CM | POA: Insufficient documentation

## 2021-02-11 DIAGNOSIS — E649 Sequelae of unspecified nutritional deficiency: Secondary | ICD-10-CM | POA: Insufficient documentation

## 2021-02-11 DIAGNOSIS — K912 Postsurgical malabsorption, not elsewhere classified: Secondary | ICD-10-CM | POA: Insufficient documentation

## 2021-02-11 LAB — COMPREHENSIVE METABOLIC PANEL
ALT: 21 U/L (ref 0–55)
AST (SGOT): 17 U/L (ref 10–42)
Albumin/Globulin Ratio: 1.16 Ratio (ref 0.80–2.00)
Albumin: 3.6 gm/dL (ref 3.5–5.0)
Alkaline Phosphatase: 65 U/L (ref 40–145)
Anion Gap: 16.7 mMol/L (ref 7.0–18.0)
BUN / Creatinine Ratio: 10.7 Ratio (ref 10.0–30.0)
BUN: 8 mg/dL (ref 7–22)
Bilirubin, Total: 1.1 mg/dL (ref 0.1–1.2)
CO2: 22 mMol/L (ref 20–30)
Calcium: 9.6 mg/dL (ref 8.5–10.5)
Chloride: 108 mMol/L (ref 98–110)
Creatinine: 0.75 mg/dL (ref 0.60–1.20)
EGFR: 99 mL/min/{1.73_m2} (ref 60–150)
Globulin: 3.1 gm/dL (ref 2.0–4.0)
Glucose: 125 mg/dL — ABNORMAL HIGH (ref 71–99)
Osmolality Calculated: 283 mOsm/kg (ref 275–300)
Potassium: 4.7 mMol/L (ref 3.5–5.3)
Protein, Total: 6.7 gm/dL (ref 6.0–8.3)
Sodium: 142 mMol/L (ref 136–147)

## 2021-02-11 LAB — PHOSPHORUS: Phosphorus: 3.6 mg/dL (ref 2.3–4.7)

## 2021-02-11 LAB — HEMOGLOBIN A1C: Hgb A1C, %: 5.6 %

## 2021-02-11 LAB — LIPID PANEL
Cholesterol: 135 mg/dL (ref 75–199)
Coronary Heart Disease Risk: 2.93
HDL: 46 mg/dL (ref 45–65)
LDL Calculated: 76 mg/dL
Triglycerides: 64 mg/dL (ref 10–150)
VLDL: 13 (ref 0–40)

## 2021-02-11 LAB — CBC AND DIFFERENTIAL
Basophils %: 0.6 % (ref 0.0–3.0)
Basophils Absolute: 0 10*3/uL (ref 0.0–0.3)
Eosinophils %: 1.5 % (ref 0.0–7.0)
Eosinophils Absolute: 0.1 10*3/uL (ref 0.0–0.8)
Hematocrit: 45.5 % (ref 36.0–48.0)
Hemoglobin: 15.4 gm/dL (ref 12.0–16.0)
Lymphocytes Absolute: 1.4 10*3/uL (ref 0.6–5.1)
Lymphocytes: 21.8 % (ref 15.0–46.0)
MCH: 32 pg (ref 28–35)
MCHC: 34 gm/dL (ref 32–36)
MCV: 94 fL (ref 80–100)
MPV: 8.3 fL (ref 6.0–10.0)
Monocytes Absolute: 0.4 10*3/uL (ref 0.1–1.7)
Monocytes: 6.9 % (ref 3.0–15.0)
Neutrophils %: 69.2 % (ref 42.0–78.0)
Neutrophils Absolute: 4.5 10*3/uL (ref 1.7–8.6)
PLT CT: 299 10*3/uL (ref 130–440)
RBC: 4.86 10*6/uL (ref 3.80–5.00)
RDW: 12.8 % (ref 11.0–14.0)
WBC: 6.5 10*3/uL (ref 4.0–11.0)

## 2021-02-11 LAB — IRON PROFILE
% Saturation: 29 % (ref 15–50)
Iron: 95 ug/dL (ref 40.0–160.0)
TIBC: 333 ug/dL (ref 250–450)
Transferrin: 238 mg/dL (ref 180.0–382.0)

## 2021-02-11 LAB — MAGNESIUM: Magnesium: 2.1 mg/dL (ref 1.6–2.6)

## 2021-02-11 LAB — VITAMIN B12: Vitamin B-12: 638 pg/mL (ref 213–816)

## 2021-02-12 LAB — VITAMIN D,25 OH,TOTAL: Vitamin D 25-Hydroxy: 34.9 ng/mL (ref 30.0–80.0)

## 2021-02-15 LAB — VH MISCELLANEOUS TEST: Test code and name of test: 467

## 2021-02-21 ENCOUNTER — Other Ambulatory Visit (INDEPENDENT_AMBULATORY_CARE_PROVIDER_SITE_OTHER): Payer: Self-pay

## 2021-02-21 ENCOUNTER — Telehealth (INDEPENDENT_AMBULATORY_CARE_PROVIDER_SITE_OTHER): Payer: Self-pay | Admitting: Family

## 2021-04-30 ENCOUNTER — Emergency Department
Admission: EM | Admit: 2021-04-30 | Discharge: 2021-05-01 | Disposition: A | Discharge status: Home / Self Care | Payer: No Typology Code available for payment source | Attending: Physician Assistant | Admitting: Physician Assistant

## 2021-04-30 DIAGNOSIS — N201 Calculus of ureter: Secondary | ICD-10-CM | POA: Insufficient documentation

## 2021-04-30 LAB — CBC AND DIFFERENTIAL
Basophils %: 0.3 % (ref 0.0–3.0)
Basophils Absolute: 0 10*3/uL (ref 0.0–0.3)
Eosinophils %: 2.3 % (ref 0.0–7.0)
Eosinophils Absolute: 0.2 10*3/uL (ref 0.0–0.8)
Hematocrit: 43.3 % (ref 36.0–48.0)
Hemoglobin: 14.2 gm/dL (ref 12.0–16.0)
Lymphocytes Absolute: 2.3 10*3/uL (ref 0.6–5.1)
Lymphocytes: 27.1 % (ref 15.0–46.0)
MCH: 31 pg (ref 28–35)
MCHC: 33 gm/dL (ref 32–36)
MCV: 94 fL (ref 80–100)
MPV: 7.7 fL (ref 6.0–10.0)
Monocytes Absolute: 0.6 10*3/uL (ref 0.1–1.7)
Monocytes: 6.7 % (ref 3.0–15.0)
Neutrophils %: 63.5 % (ref 42.0–78.0)
Neutrophils Absolute: 5.4 10*3/uL (ref 1.7–8.6)
PLT CT: 316 10*3/uL (ref 130–440)
RBC: 4.62 10*6/uL (ref 3.80–5.00)
RDW: 12 % (ref 11.0–14.0)
WBC: 8.5 10*3/uL (ref 4.0–11.0)

## 2021-04-30 LAB — COMPREHENSIVE METABOLIC PANEL
ALT: 14 U/L (ref 0–55)
AST (SGOT): 19 U/L (ref 10–42)
Albumin/Globulin Ratio: 1.03 Ratio (ref 0.80–2.00)
Albumin: 3.7 gm/dL (ref 3.5–5.0)
Alkaline Phosphatase: 69 U/L (ref 40–145)
Anion Gap: 13.4 mMol/L (ref 7.0–18.0)
BUN / Creatinine Ratio: 17.1 Ratio (ref 10.0–30.0)
BUN: 13 mg/dL (ref 7–22)
Bilirubin, Total: 0.7 mg/dL (ref 0.1–1.2)
CO2: 25 mMol/L (ref 20–30)
Calcium: 9.3 mg/dL (ref 8.5–10.5)
Chloride: 107 mMol/L (ref 98–110)
Creatinine: 0.76 mg/dL (ref 0.60–1.20)
EGFR: 101 mL/min/{1.73_m2} (ref 60–150)
Globulin: 3.6 gm/dL (ref 2.0–4.0)
Glucose: 116 mg/dL — ABNORMAL HIGH (ref 71–99)
Osmolality Calculated: 282 mOsm/kg (ref 275–300)
Potassium: 4.4 mMol/L (ref 3.5–5.3)
Protein, Total: 7.3 gm/dL (ref 6.0–8.3)
Sodium: 141 mMol/L (ref 136–147)

## 2021-04-30 LAB — HCG, SERUM, QUALITATIVE: BHCG Qualitative: NEGATIVE

## 2021-05-01 ENCOUNTER — Emergency Department: Payer: No Typology Code available for payment source

## 2021-05-01 LAB — VH URINALYSIS WITH MICROSCOPIC
Bilirubin, UA: NEGATIVE
Glucose, UA: NEGATIVE mg/dL
Ketones UA: 5 mg/dL
Leukocyte Esterase, UA: 25 Leu/uL — AB
Nitrite, UA: NEGATIVE
Protein, UR: 100 mg/dL — AB
RBC, UA: >182 /hpf — ABNORMAL HIGH (ref 0–5)
Squam Epithel, UA: 2 /hpf (ref 0–2)
Urine Specific Gravity: 1.018 (ref 1.001–1.040)
Urobilinogen, UA: NORMAL mg/dL
WBC, UA: 5 /hpf — ABNORMAL HIGH (ref 0–4)
pH, Urine: 6 pH (ref 5.0–8.0)

## 2021-05-01 MED ORDER — KETOROLAC TROMETHAMINE 10 MG PO TABS
10.0000 mg | ORAL_TABLET | Freq: Three times a day (TID) | ORAL | 0 refills | Status: DC | PRN
Start: 2021-05-01 — End: 2021-05-01

## 2021-05-01 MED ORDER — ONDANSETRON 4 MG PO TBDP
4.0000 mg | ORAL_TABLET | Freq: Three times a day (TID) | ORAL | 0 refills | Status: DC | PRN
Start: 2021-05-01 — End: 2022-02-12

## 2021-05-01 MED ORDER — ONDANSETRON HCL 4 MG/2ML IJ SOLN
INTRAMUSCULAR | Status: AC
Start: 2021-05-01 — End: ?
  Filled 2021-05-01: qty 2

## 2021-05-01 MED ORDER — ONDANSETRON HCL 4 MG/2ML IJ SOLN
4.0000 mg | Freq: Once | INTRAMUSCULAR | Status: AC
Start: 2021-05-01 — End: 2021-05-01
  Administered 2021-05-01: 4 mg via INTRAVENOUS

## 2021-05-01 MED ORDER — HYDROCODONE-ACETAMINOPHEN 5-325 MG PO TABS
1.0000 | ORAL_TABLET | Freq: Four times a day (QID) | ORAL | 0 refills | Status: DC | PRN
Start: 2021-05-01 — End: 2022-02-12

## 2021-05-01 MED ORDER — KETOROLAC TROMETHAMINE 15 MG/ML IJ SOLN
INTRAMUSCULAR | Status: AC
Start: 2021-05-01 — End: ?
  Filled 2021-05-01: qty 1

## 2021-05-01 MED ORDER — TAMSULOSIN HCL 0.4 MG PO CAPS
0.4000 mg | ORAL_CAPSULE | Freq: Every day | ORAL | 0 refills | Status: AC
Start: 2021-05-01 — End: 2021-05-15

## 2021-05-01 MED ORDER — VH SODIUM CHLORIDE 0.9 % IV BOLUS
1000.0000 mL | Freq: Once | INTRAVENOUS | Status: AC
Start: 2021-05-01 — End: 2021-05-01
  Administered 2021-05-01: 1000 mL via INTRAVENOUS

## 2021-05-01 MED ORDER — KETOROLAC TROMETHAMINE 15 MG/ML IJ SOLN
15.0000 mg | Freq: Once | INTRAMUSCULAR | Status: AC
Start: 2021-05-01 — End: 2021-05-01
  Administered 2021-05-01: 15 mg via INTRAVENOUS

## 2021-05-01 NOTE — ED Provider Notes (Signed)
[facility]  EMERGENCY DEPARTMENT  History and Physical Exam       Patient Name: Cynthia Rojas, Cynthia Rojas  Encounter Date:  04/30/2021  Physician Assistant: Shanda Howells, PA-C  Attending Physician: Dr. Bernadette Hoit  PCP: Vickki Hearing, PA  Patient DOB:  12/08/1980  MRN:  [id number]  Room:  N6/N6-A    History of Presenting Illness     Chief complaint: Flank Pain    HPI/ROS given by: Patient    Cynthia Rojas is a 40 y.o. female who presents with progressive left flank pain present for the past 24 hours acutely increasing this evening and associated with nausea prompting ED arrival. She reports pain to the LLQ that radiates posteriorly into L lumbar area. Described as aching, sharp. Rated 10/10 in severity. Not alleviated with OTC Tylenol. She denies any associated dysuria, frequency, pressure until the development of hematuria while in the ED. Denies fever or chills. Reports history of ovarian cyst in the past but states this evening's symptoms are different.      Review of Systems     Review of Systems   Constitutional: Negative for chills and fever.   HENT: Negative for congestion, ear pain and sore throat.    Eyes: Negative for redness and visual disturbance.   Respiratory: Negative for cough and shortness of breath.    Cardiovascular: Negative for chest pain and palpitations.   Gastrointestinal: Negative for abdominal pain, diarrhea, nausea and vomiting.   Genitourinary: Positive for flank pain and hematuria. Negative for difficulty urinating and dysuria.   Musculoskeletal: Negative for back pain.   Skin: Negative for rash.   Neurological: Negative for weakness, numbness and headaches.   Hematological: Does not bruise/bleed easily.   Psychiatric/Behavioral: Negative.    All other systems reviewed and are negative.       Allergies & Medications     Pt is allergic to amoxicillin.    Discharge Medication List as of 05/01/2021  1:35 AM      CONTINUE these medications which have NOT CHANGED     Details   Cyanocobalamin (VITAMIN B-12 CR PO) Take by mouth, Historical Med      Multiple Vitamin (multivitamin) capsule Take 1 capsule by mouth daily, Historical Med      pantoprazole (PROTONIX) 40 MG tablet TAKE 1 TABLET BY MOUTH ONCE PER DAY AS NEEDED, Historical Med      levonorgestrel (MIRENA) 20 MCG/24HR IUD 1 each by Intrauterine route once, Historical Med              Past Medical History     Pt has a past medical history of Anxiety (12/11/2017), Dysthymia (12/11/2017), Irritable bowel syndrome (04/1999), Migraine (10/2004), Morbid obesity with BMI of 40.0-44.9, adult (02/11/2017), No known health problems, and Type 2 diabetes mellitus (12/06/2017).     Past Surgical History     Pt  has a past surgical history that includes Cholecystectomy (10/2005).     Family History     The family history includes Celiac disease in her mother; Crohn's disease in her maternal grandmother; Depression in her mother; Diabetes in her father; Heart attack in her paternal grandfather; Heart disease in her maternal grandfather.     Social History     Pt reports that she has never smoked. She has never used smokeless tobacco. She reports current alcohol use of about 2.0 standard drinks of alcohol per week. She reports that she does not use drugs.     Physical Exam  Blood pressure (!) 87/56, pulse 65, temperature 97.3 F (36.3 C), temperature source Temporal, resp. rate 18, weight 98.7 kg, SpO2 95 %.    Physical Exam  Constitutional:       General: She is in acute distress (appears painful).      Appearance: She is well-developed.   HENT:      Head: Normocephalic and atraumatic.      Right Ear: External ear normal.      Left Ear: External ear normal.   Eyes:      General:         Right eye: No discharge.         Left eye: No discharge.      Conjunctiva/sclera: Conjunctivae normal.      Pupils: Pupils are equal, round, and reactive to light.   Cardiovascular:      Rate and Rhythm: Normal rate and regular rhythm.      Heart sounds: No  murmur heard.  Pulmonary:      Effort: Pulmonary effort is normal. No respiratory distress.      Breath sounds: Normal breath sounds.   Abdominal:      Palpations: Abdomen is soft.      Tenderness: There is abdominal tenderness in the left lower quadrant. There is left CVA tenderness.   Musculoskeletal:         General: Normal range of motion.      Cervical back: Normal range of motion and neck supple.   Skin:     General: Skin is warm and dry.   Neurological:      Mental Status: She is alert and oriented to person, place, and time.   Psychiatric:         Behavior: Behavior normal.          Diagnostic Results     The results of the diagnostic studies below have been reviewed by myself:    Labs  Results     Procedure Component Value Units Date/Time    Urinalysis with Microscopic [[id number]]  (Abnormal) Collected: 05/01/21 0047    Specimen: Urine, Random Updated: 05/01/21 0104     Color, UA Amber - Color may affect some analytes.     Clarity, UA Slightly Cloudy     Urine Specific Gravity 1.018     pH, Urine 6.0 pH      Protein, UR 100 mg/dL      Glucose, UA Negative mg/dL      Ketones UA 5 mg/dL      Bilirubin, UA Negative     Blood, UA Large     Nitrite, UA Negative     Urobilinogen, UA Normal mg/dL      Leukocyte Esterase, UA 25 Leu/uL      UR Micro Performed     WBC, UA 5 /hpf      RBC, UA >182 /hpf      Bacteria, UA Many /hpf      Squam Epithel, UA 2 /hpf     Comprehensive metabolic panel [[id number]]  (Abnormal) Collected: 04/30/21 2235    Specimen: Plasma Updated: 04/30/21 2314     Sodium 141 mMol/L      Potassium 4.4 mMol/L      Chloride 107 mMol/L      CO2 25 mMol/L      Calcium 9.3 mg/dL      Glucose 034 mg/dL      Creatinine 7.42 mg/dL      BUN 13 mg/dL  Protein, Total 7.3 gm/dL      Albumin 3.7 gm/dL      Alkaline Phosphatase 69 U/L      ALT 14 U/L      AST (SGOT) 19 U/L      Bilirubin, Total 0.7 mg/dL      Albumin/Globulin Ratio 1.03 Ratio      Anion Gap 13.4 mMol/L      BUN / Creatinine Ratio 17.1  Ratio      EGFR 101 mL/min/1.91m2      Osmolality Calculated 282 mOsm/kg      Globulin 3.6 gm/dL     Beta HCG, Qual, Serum [[id number]] Collected: 04/30/21 2235    Specimen: Plasma Updated: 04/30/21 2314     BHCG Qualitative Negative    CBC and differential [[id number]] Collected: 04/30/21 2235    Specimen: Blood Updated: 04/30/21 2252     WBC 8.5 K/cmm      RBC 4.62 M/cmm      Hemoglobin 14.2 gm/dL      Hematocrit 78.2 %      MCV 94 fL      MCH 31 pg      MCHC 33 gm/dL      RDW 95.6 %      PLT CT 316 K/cmm      MPV 7.7 fL      Neutrophils % 63.5 %      Lymphocytes 27.1 %      Monocytes 6.7 %      Eosinophils % 2.3 %      Basophils % 0.3 %      Neutrophils Absolute 5.4 K/cmm      Lymphocytes Absolute 2.3 K/cmm      Monocytes Absolute 0.6 K/cmm      Eosinophils Absolute 0.2 K/cmm      Basophils Absolute 0.0 K/cmm           Radiologic Studies  CT Abdomen Pelvis dry  (Stone)    Result Date: 05/01/2021  1. 2 mm stone in the left ureter with mild hydronephrosis. 2. Left lower lobe nodule is stable from 2020 and considered benign. ReadingStation:[id number]      EKG: none     ED Meds     ED Medication Orders (From admission, onward)    Start Ordered     Status Ordering Provider    05/01/21 0214 05/01/21 0213  sodium chloride 0.9 % bolus 1,000 mL  Once in ED        Route: Intravenous  Ordered Dose: 1,000 mL     Last MAR action: New Bag MCCABE, ALEXIS L    05/01/21 0050 05/01/21 0049  ondansetron (ZOFRAN) injection 4 mg  Once in ED        Route: Intravenous  Ordered Dose: 4 mg     Last MAR action: Given Shanda Howells E    05/01/21 0050 05/01/21 0049  ketorolac (TORADOL) injection 15 mg  Once in ED        Route: Intravenous  Ordered Dose: 15 mg     Last MAR action: Given Shanda Howells E    05/01/21 0050 05/01/21 0049  sodium chloride 0.9 % bolus 1,000 mL  Once in ED        Route: Intravenous  Ordered Dose: 1,000 mL     Last MAR action: Stopped Shanda Howells E           ED Course and Medical Decision Making     Old medical  records and nursing/triage notes  were reviewed by myself.    DIAGNOSTIC CONSIDERATIONS    The differential diagnosis includes, but is not limited to vomiting, ureterolithiasis/renal colic, UTI/pyelonephritis, gastroenteritis/gastritis, peptic ulcer disease, GERD, appendicitis, aortic aneurysm, MI/angina/mesenteric ischemia, bowel perforation/obstruction, pancreatitis, cholecystitis, biliary colic, diverticulitis    CONSULTS  None    ED COURSE & MDM     In addition to the above history, please see nursing notes. Allergies, meds, past medical, family, social hx, and the results of the diagnostic studies performed have been reviewed by myself.    This chart was generated by an EMR and may contain errors or omissions not intended by the user.     Procedures / Critical Care     None     Diagnosis / Disposition     Clinical Impression  1. Left ureteral stone        Disposition  ED Disposition     ED Disposition   Discharge    Condition   --    Date/Time   Mon May 01, 2021  1:35 AM    Comment   Abbie Sons discharge to home/self care.    Condition at disposition: Stable               Follow up for Discharged Patients  [facility] Emergency Department  [address]  [place] [address]  [phone]  Follow up  If symptoms worsen    Shawn Route, MD  [address]  [place] [address]  [phone]    Schedule an appointment as soon as possible for a visit        Prescriptions for Discharged Patients  Discharge Medication List as of 05/01/2021  1:35 AM      START taking these medications    Details   ondansetron (ZOFRAN-ODT) 4 MG disintegrating tablet Take 1 tablet (4 mg total) by mouth every 8 (eight) hours as needed for Nausea, Starting Mon 05/01/2021, E-Rx      tamsulosin (FLOMAX) 0.4 MG Cap Take 1 capsule (0.4 mg total) by mouth Daily after dinner for 14 days, Starting Mon 05/01/2021, Until Mon 05/15/2021, E-Rx      ketorolac (TORADOL) 10 MG tablet Take 1 tablet (10 mg total) by mouth 3 (three) times  daily as needed for Pain, Starting Mon 05/01/2021, E-Rx                   Ruffin Pyo, PA  05/02/21 2141       Oretha Ellis, MD  05/05/21 [phone]

## 2021-05-01 NOTE — ED Notes (Signed)
Pt to CT via stretcher

## 2021-05-01 NOTE — ED Notes (Signed)
Rachael, PA, is aware of pt's low BP of 79/55. VBO for 1L of NaCl. Pt states after her bariatric surgery her BP can normally be as low as 80's systolic normally.

## 2021-06-13 ENCOUNTER — Emergency Department
Admission: EM | Admit: 2021-06-13 | Discharge: 2021-06-13 | Disposition: A | Discharge status: Home / Self Care | Payer: No Typology Code available for payment source | Attending: Emergency Medicine | Admitting: Emergency Medicine

## 2021-06-13 ENCOUNTER — Emergency Department: Payer: No Typology Code available for payment source

## 2021-06-13 DIAGNOSIS — R1032 Left lower quadrant pain: Secondary | ICD-10-CM | POA: Insufficient documentation

## 2021-06-13 DIAGNOSIS — N201 Calculus of ureter: Secondary | ICD-10-CM | POA: Insufficient documentation

## 2021-06-13 DIAGNOSIS — R42 Dizziness and giddiness: Secondary | ICD-10-CM | POA: Insufficient documentation

## 2021-06-13 LAB — HCG, SERUM, QUALITATIVE: BHCG Qualitative: NEGATIVE

## 2021-06-13 LAB — COMPREHENSIVE METABOLIC PANEL
ALT: 151 U/L — ABNORMAL HIGH (ref 0–55)
AST (SGOT): 173 U/L — ABNORMAL HIGH (ref 10–42)
Albumin/Globulin Ratio: 0.97 Ratio (ref 0.80–2.00)
Albumin: 3.5 gm/dL (ref 3.5–5.0)
Alkaline Phosphatase: 93 U/L (ref 40–145)
Anion Gap: 15 mMol/L (ref 7.0–18.0)
BUN / Creatinine Ratio: 11.9 Ratio (ref 10.0–30.0)
BUN: 10 mg/dL (ref 7–22)
Bilirubin, Total: 0.6 mg/dL (ref 0.1–1.2)
CO2: 24 mMol/L (ref 20–30)
Calcium: 9 mg/dL (ref 8.5–10.5)
Chloride: 107 mMol/L (ref 98–110)
Creatinine: 0.84 mg/dL (ref 0.60–1.20)
EGFR: 89 mL/min/{1.73_m2} (ref 60–150)
Globulin: 3.6 gm/dL (ref 2.0–4.0)
Glucose: 137 mg/dL — ABNORMAL HIGH (ref 71–99)
Osmolality Calculated: 284 mOsm/kg (ref 275–300)
Potassium: 4 mMol/L (ref 3.5–5.3)
Protein, Total: 7.1 gm/dL (ref 6.0–8.3)
Sodium: 142 mMol/L (ref 136–147)

## 2021-06-13 LAB — VH URINALYSIS WITH MICROSCOPIC AND CULTURE IF INDICATED
Bilirubin, UA: NEGATIVE
Blood, UA: NEGATIVE
Glucose, UA: NEGATIVE mg/dL
Ketones UA: 20 mg/dL — AB
Leukocyte Esterase, UA: 25 Leu/uL — AB
Nitrite, UA: NEGATIVE
Protein, UR: NEGATIVE mg/dL
RBC, UA: 3 /hpf (ref 0–5)
Squam Epithel, UA: 4 /hpf — ABNORMAL HIGH (ref 0–2)
Urine Specific Gravity: 1.025 (ref 1.001–1.040)
Urobilinogen, UA: NORMAL mg/dL
WBC, UA: 6 /hpf — ABNORMAL HIGH (ref 0–4)
pH, Urine: 6 pH (ref 5.0–8.0)

## 2021-06-13 LAB — CBC AND DIFFERENTIAL
Basophils %: 0.3 % (ref 0.0–3.0)
Basophils Absolute: 0 10*3/uL (ref 0.0–0.3)
Eosinophils %: 0.5 % (ref 0.0–7.0)
Eosinophils Absolute: 0 10*3/uL (ref 0.0–0.8)
Hematocrit: 44.6 % (ref 36.0–48.0)
Hemoglobin: 14.9 gm/dL (ref 12.0–16.0)
Lymphocytes Absolute: 0.8 10*3/uL (ref 0.6–5.1)
Lymphocytes: 23.9 % (ref 15.0–46.0)
MCH: 31 pg (ref 28–35)
MCHC: 33 gm/dL (ref 32–36)
MCV: 93 fL (ref 80–100)
MPV: 7.8 fL (ref 6.0–10.0)
Monocytes Absolute: 0.3 10*3/uL (ref 0.1–1.7)
Monocytes: 8.8 % (ref 3.0–15.0)
Neutrophils %: 66.5 % (ref 42.0–78.0)
Neutrophils Absolute: 2.3 10*3/uL (ref 1.7–8.6)
PLT CT: 192 10*3/uL (ref 130–440)
RBC: 4.81 10*6/uL (ref 3.80–5.00)
RDW: 12.1 % (ref 11.0–14.0)
WBC: 3.4 10*3/uL — ABNORMAL LOW (ref 4.0–11.0)

## 2021-06-13 LAB — VH EXTRA SPECIMEN LABEL

## 2021-06-13 MED ORDER — HYDROMORPHONE HCL 0.5 MG/0.5 ML IJ SOLN
0.5000 mg | Freq: Once | INTRAMUSCULAR | Status: AC
Start: 2021-06-13 — End: 2021-06-13
  Administered 2021-06-13: 0.5 mg via INTRAVENOUS

## 2021-06-13 MED ORDER — KETOROLAC TROMETHAMINE 15 MG/ML IJ SOLN
15.0000 mg | Freq: Once | INTRAMUSCULAR | Status: AC
Start: 2021-06-13 — End: 2021-06-13
  Administered 2021-06-13: 11:00:00 15 mg via INTRAVENOUS

## 2021-06-13 MED ORDER — OXYCODONE-ACETAMINOPHEN 7.5-325 MG PO TABS
1.0000 | ORAL_TABLET | Freq: Four times a day (QID) | ORAL | 0 refills | Status: DC | PRN
Start: 2021-06-13 — End: 2022-02-12

## 2021-06-13 MED ORDER — ONDANSETRON HCL 4 MG/2ML IJ SOLN
INTRAMUSCULAR | Status: AC
Start: 2021-06-13 — End: ?
  Filled 2021-06-13: qty 2

## 2021-06-13 MED ORDER — ONDANSETRON HCL 4 MG/2ML IJ SOLN
4.0000 mg | Freq: Once | INTRAMUSCULAR | Status: AC
Start: 2021-06-13 — End: 2021-06-13
  Administered 2021-06-13: 11:00:00 4 mg via INTRAVENOUS

## 2021-06-13 MED ORDER — ONDANSETRON 4 MG PO TBDP
4.0000 mg | ORAL_TABLET | Freq: Three times a day (TID) | ORAL | 0 refills | Status: DC | PRN
Start: 2021-06-13 — End: 2022-02-12

## 2021-06-13 MED ORDER — VH SODIUM CHLORIDE 0.9 % IV BOLUS
1000.0000 mL | Freq: Once | INTRAVENOUS | Status: AC
Start: 2021-06-13 — End: 2021-06-13
  Administered 2021-06-13: 11:00:00 1000 mL via INTRAVENOUS

## 2021-06-13 MED ORDER — KETOROLAC TROMETHAMINE 15 MG/ML IJ SOLN
INTRAMUSCULAR | Status: AC
Start: 2021-06-13 — End: ?
  Filled 2021-06-13: qty 1

## 2021-06-13 MED ORDER — HYDROMORPHONE HCL 0.5 MG/0.5 ML IJ SOLN
INTRAMUSCULAR | Status: AC
Start: 2021-06-13 — End: ?
  Filled 2021-06-13: qty 0.5

## 2021-06-13 NOTE — ED Provider Notes (Signed)
History     Chief Complaint   Patient presents with    Flank Pain       Chief complaint: Flank pain  Onset: 9 PM last evening  Severity: Moderate    Patient is a 40 year old female with a history of type 2 diabetes, kidney stone in June 2022 who presents emergency department with possible kidney stone, pain starting around 9 PM last evening.  She developed left flank pain with associated "bladder pain".  Symptoms of urinary urgency, decreased output.  She took 1 hydrocodone without relief of symptoms.  At approximately midnight, began vomiting with severe pain.  Her pain is now progressed to left lower quadrant and left back.  No diarrhea.    Prior abdominal surgery includes a cholecystectomy and gastric sleeve (December 2021)    CT of the abdomen pelvis on 05/01/2021: 2 mm stone in the proximal left ureter and mild hydronephrosis.  Stable left lobe nodule from 2020.      Past Medical History:   Diagnosis Date    Anxiety 12/11/2017    Dysthymia 12/11/2017    Irritable bowel syndrome 04/1999    Migraine 10/2004    Morbid obesity with BMI of 40.0-44.9, adult 02/11/2017    No known health problems     Type 2 diabetes mellitus 12/06/2017    HgA1C 6.5 % on 12/05/2017       Past Surgical History:   Procedure Laterality Date    CHOLECYSTECTOMY  10/2005       Family History   Problem Relation Age of Onset    Depression Mother     Celiac disease Mother     Diabetes Father     Crohn's disease Maternal Grandmother     Heart disease Maternal Grandfather     Heart attack Paternal Grandfather        Social  Social History     Tobacco Use    Smoking status: Never    Smokeless tobacco: Never    Tobacco comments:     Never used   Vaping Use    Vaping Use: Never used   Substance Use Topics    Alcohol use: Yes     Alcohol/week: 2.0 standard drinks     Types: 1 Cans of beer, 1 Standard drinks or equivalent per week     Comment: social    Drug use: Never       .     Allergies   Allergen Reactions    Amoxicillin Rash       Home Medications        Med List Status: In Progress Set By: Abran Duke, RN at 06/13/2021  9:52 AM              Cyanocobalamin (VITAMIN B-12 CR PO)     Take by mouth     HYDROcodone-acetaminophen (NORCO) 5-325 MG per tablet     Take 1 tablet by mouth every 6 (six) hours as needed for Pain     levonorgestrel (MIRENA) 20 MCG/24HR IUD     1 each by Intrauterine route once     Multiple Vitamin (multivitamin) capsule     Take 1 capsule by mouth daily     ondansetron (ZOFRAN-ODT) 4 MG disintegrating tablet     Take 1 tablet (4 mg total) by mouth every 8 (eight) hours as needed for Nausea     pantoprazole (PROTONIX) 40 MG tablet     TAKE 1 TABLET BY MOUTH ONCE PER DAY AS  NEEDED             Review of Systems   Constitutional:  Positive for activity change, appetite change and fatigue.   Gastrointestinal:  Positive for abdominal pain, nausea and vomiting. Negative for diarrhea.   Genitourinary:  Positive for decreased urine volume, flank pain, frequency, pelvic pain and urgency. Negative for dysuria.   Skin: Negative.    Neurological:  Positive for dizziness and light-headedness.   Hematological: Negative.    All other systems reviewed and are negative.    Physical Exam    BP: 112/75, Heart Rate: 67, Temp: 97.9 F (36.6 C), Resp Rate: 18, SpO2: 100 %, Weight: 91.6 kg     Physical Exam  Vitals and nursing note reviewed.   Constitutional:       General: She is not in acute distress.     Appearance: Normal appearance. She is not ill-appearing, toxic-appearing or diaphoretic.   HENT:      Head: Normocephalic and atraumatic.      Nose: Nose normal.      Mouth/Throat:      Mouth: Mucous membranes are dry.   Abdominal:      Palpations: Abdomen is soft.      Tenderness: There is abdominal tenderness. There is no left CVA tenderness.      Comments: Left lower quadrant abdominal tenderness   Musculoskeletal:         General: Normal range of motion.      Cervical back: Normal range of motion.   Skin:     General: Skin is warm.      Coloration: Skin  is pale.   Neurological:      General: No focal deficit present.      Mental Status: She is alert and oriented to person, place, and time.   Psychiatric:         Mood and Affect: Mood normal.         MDM and ED Course     ED Medication Orders (From admission, onward)      Start Ordered     Status Ordering Provider    06/13/21 1023 06/13/21 1022  ondansetron (ZOFRAN) injection 4 mg  Once in ED        Route: Intravenous  Ordered Dose: 4 mg     Last MAR action: Given Jazmeen Axtell ANNE    06/13/21 1023 06/13/21 1022  ketorolac (TORADOL) injection 15 mg  Once in ED        Route: Intravenous  Ordered Dose: 15 mg     Last MAR action: Given Stephania Macfarlane ANNE    06/13/21 1023 06/13/21 1022  sodium chloride 0.9 % bolus 1,000 mL  Once in ED        Route: Intravenous  Ordered Dose: 1,000 mL     Last MAR action: New Bag Michella Detjen ANNE    06/13/21 1023 06/13/21 1022  HYDROmorphone (DILAUDID) injection 0.5 mg  Once in ED        Route: Intravenous  Ordered Dose: 0.5 mg     Last MAR action: Given Blanca Friend           Results       Procedure Component Value Units Date/Time    Urine Culture [[id number]] Collected: 06/13/21 1007    Specimen: Urine, Random Updated: 06/13/21 1203     Culture Result Culture In Progress    Urinalysis w Microscopic and Culture if Indicated [[id number]]  (Abnormal) Collected: 06/13/21 1007  Specimen: Urine, Random Updated: 06/13/21 1046     Color, UA Yellow     Clarity, UA Clear     Urine Specific Gravity 1.025     pH, Urine 6.0 pH      Protein, UR Negative mg/dL      Glucose, UA Negative mg/dL      Ketones UA 20 mg/dL      Bilirubin, UA Negative     Blood, UA Negative     Nitrite, UA Negative     Urobilinogen, UA Normal mg/dL      Leukocyte Esterase, UA 25 Leu/uL      UR Micro Performed     WBC, UA 6 /hpf      RBC, UA 3 /hpf      Bacteria, UA Many /hpf      Squam Epithel, UA 4 /hpf     Narrative:      A Urine Culture has been ordered based upon the Positive UA results.    Beta HCG,  Qual, Serum [[id number]] Collected: 06/13/21 0822    Specimen: Plasma Updated: 06/13/21 0859     BHCG Qualitative Negative    Comprehensive metabolic panel [[id number]]  (Abnormal) Collected: 06/13/21 0822    Specimen: Plasma Updated: 06/13/21 0859     Sodium 142 mMol/L      Potassium 4.0 mMol/L      Chloride 107 mMol/L      CO2 24 mMol/L      Calcium 9.0 mg/dL      Glucose 962 mg/dL      Creatinine 9.52 mg/dL      BUN 10 mg/dL      Protein, Total 7.1 gm/dL      Albumin 3.5 gm/dL      Alkaline Phosphatase 93 U/L      ALT 151 U/L      AST (SGOT) 173 U/L      Bilirubin, Total 0.6 mg/dL      Albumin/Globulin Ratio 0.97 Ratio      Anion Gap 15.0 mMol/L      BUN / Creatinine Ratio 11.9 Ratio      EGFR 89 mL/min/1.85m2      Osmolality Calculated 284 mOsm/kg      Globulin 3.6 gm/dL     CBC and differential [[id number]]  (Abnormal) Collected: 06/13/21 0822    Specimen: Blood Updated: 06/13/21 0838     WBC 3.4 K/cmm      RBC 4.81 M/cmm      Hemoglobin 14.9 gm/dL      Hematocrit 02.7 %      MCV 93 fL      MCH 31 pg      MCHC 33 gm/dL      RDW 25.3 %      PLT CT 192 K/cmm      MPV 7.8 fL      Neutrophils % 66.5 %      Lymphocytes 23.9 %      Monocytes 8.8 %      Eosinophils % 0.5 %      Basophils % 0.3 %      Neutrophils Absolute 2.3 K/cmm      Lymphocytes Absolute 0.8 K/cmm      Monocytes Absolute 0.3 K/cmm      Eosinophils Absolute 0.0 K/cmm      Basophils Absolute 0.0 K/cmm     Collect Blood [[id number]] Collected: 06/13/21 2595    Specimen: Other Updated: 06/13/21 0826     Collect Blood Label Notification  CT Abdomen Pelvis dry  Larina Bras)    Result Date: 06/13/2021  4 mm calculus left ureterovesical junction causing moderate obstruction. ReadingStation:[id number]    MDM  Number of Diagnoses or Management Options  Ureterolithiasis: new and requires workup     Amount and/or Complexity of Data Reviewed  Clinical lab tests: reviewed  Tests in the radiology section of CPT: reviewed and ordered    Risk of Complications,  Morbidity, and/or Mortality  Presenting problems: moderate  Management options: high    Patient Progress  Patient progress: stable      Patient is a 40 year old female who presents with symptoms of left flank pain, left lower quadrant abdominal pain.  She has prior history of ureteral stone in June 2022.    Laboratory data obtained with CBC and chemistries which were unremarkable.  UA with 6 white cells, 3 red cells.  Urine culture pending.    CT of the abdomen and pelvis with evidence of a 4 mm stone left UVJ.    She was treated supportively with IV fluids, analgesics with good clinical relief.    Plans for outpatient management and close follow-up with urology.  To return if worsening symptoms.                   Procedures    Clinical Impression & Disposition     Clinical Impression  Final diagnoses:   Ureterolithiasis        ED Disposition       ED Disposition   Discharge    Condition   --    Date/Time   Tue Jun 13, 2021 12:04 PM    Comment   Cynthia Rojas discharge to home/self care.    Condition at disposition: Stable                  New Prescriptions    ONDANSETRON (ZOFRAN ODT) 4 MG DISINTEGRATING TABLET    Take 1 tablet (4 mg total) by mouth every 8 (eight) hours as needed for Nausea    OXYCODONE-ACETAMINOPHEN (PERCOCET) 7.5-325 MG PER TABLET    Take 1 tablet by mouth every 6 (six) hours as needed for Pain (severe pain)                   Blanca Friend, MD  06/14/21 [phone]

## 2021-06-13 NOTE — ED Triage Notes (Signed)
Patient came in complaining of left flank pain. Patient states that the pain started last night and started throwing up around midnight. Patient states that she is having "a little bit" of chest pain but no shortness of breath. Patient states that she has been throwing up and has been nausea and reports "loose" stools. Patient states that she had a kidney stone in June, but was able to pass it on her own.

## 2021-06-15 LAB — VH CULTURE, URINE

## 2021-06-26 ENCOUNTER — Inpatient Hospital Stay: Admission: RE | Admit: 2021-06-26 | Payer: Self-pay | Source: Ambulatory Visit

## 2021-06-26 ENCOUNTER — Ambulatory Visit
Admission: RE | Admit: 2021-06-26 | Discharge: 2021-06-26 | Disposition: A | Discharge status: Home / Self Care | Payer: No Typology Code available for payment source | Source: Ambulatory Visit | Attending: Surgery | Admitting: Surgery

## 2021-06-26 DIAGNOSIS — Z9884 Bariatric surgery status: Secondary | ICD-10-CM | POA: Insufficient documentation

## 2021-06-26 DIAGNOSIS — E649 Sequelae of unspecified nutritional deficiency: Secondary | ICD-10-CM | POA: Insufficient documentation

## 2021-06-26 DIAGNOSIS — K912 Postsurgical malabsorption, not elsewhere classified: Secondary | ICD-10-CM | POA: Insufficient documentation

## 2021-06-26 LAB — CBC AND DIFFERENTIAL
Basophils %: 0.6 % (ref 0.0–3.0)
Basophils Absolute: 0 10*3/uL (ref 0.0–0.3)
Eosinophils %: 2.1 % (ref 0.0–7.0)
Eosinophils Absolute: 0.1 10*3/uL (ref 0.0–0.8)
Hematocrit: 44.3 % (ref 36.0–48.0)
Hemoglobin: 13.5 gm/dL (ref 12.0–16.0)
Lymphocytes Absolute: 1.3 10*3/uL (ref 0.6–5.1)
Lymphocytes: 23.7 % (ref 15.0–46.0)
MCH: 29 pg (ref 28–35)
MCHC: 31 gm/dL — ABNORMAL LOW (ref 32–36)
MCV: 95 fL (ref 80–100)
MPV: 8.3 fL (ref 6.0–10.0)
Monocytes Absolute: 0.4 10*3/uL (ref 0.1–1.7)
Monocytes: 8.2 % (ref 3.0–15.0)
Neutrophils %: 65.4 % (ref 42.0–78.0)
Neutrophils Absolute: 3.5 10*3/uL (ref 1.7–8.6)
PLT CT: 283 10*3/uL (ref 130–440)
RBC: 4.65 10*6/uL (ref 3.80–5.00)
RDW: 12.5 % (ref 11.0–14.0)
WBC: 5.4 10*3/uL (ref 4.0–11.0)

## 2021-06-26 LAB — VITAMIN B12: Vitamin B-12: 701 pg/mL (ref 213–816)

## 2021-06-26 LAB — COMPREHENSIVE METABOLIC PANEL
ALT: 18 U/L (ref 0–55)
AST (SGOT): 15 U/L (ref 10–42)
Albumin/Globulin Ratio: 1.06 Ratio (ref 0.80–2.00)
Albumin: 3.3 gm/dL — ABNORMAL LOW (ref 3.5–5.0)
Alkaline Phosphatase: 79 U/L (ref 40–145)
Anion Gap: 14.6 mMol/L (ref 7.0–18.0)
BUN / Creatinine Ratio: 15.7 Ratio (ref 10.0–30.0)
BUN: 11 mg/dL (ref 7–22)
Bilirubin, Total: 1.2 mg/dL (ref 0.1–1.2)
CO2: 28 mMol/L (ref 20–30)
Calcium: 9.4 mg/dL (ref 8.5–10.5)
Chloride: 107 mMol/L (ref 98–110)
Creatinine: 0.7 mg/dL (ref 0.60–1.20)
EGFR: 111 mL/min/{1.73_m2} (ref 60–150)
Globulin: 3.1 gm/dL (ref 2.0–4.0)
Glucose: 122 mg/dL — ABNORMAL HIGH (ref 71–99)
Osmolality Calculated: 289 mOsm/kg (ref 275–300)
Potassium: 4.6 mMol/L (ref 3.5–5.3)
Protein, Total: 6.4 gm/dL (ref 6.0–8.3)
Sodium: 145 mMol/L (ref 136–147)

## 2021-06-26 LAB — LIPID PANEL
Cholesterol: 133 mg/dL (ref 75–199)
Coronary Heart Disease Risk: 3.24
HDL: 41 mg/dL — ABNORMAL LOW (ref 45–65)
LDL Calculated: 74 mg/dL
Triglycerides: 91 mg/dL (ref 10–150)
VLDL: 18 (ref 0–40)

## 2021-06-26 LAB — HEMOGLOBIN A1C
Estimated Average Glucose: 108 mg/dL
Hgb A1C, %: 5.4 %

## 2021-06-26 LAB — IRON PROFILE
% Saturation: 49 % (ref 15–50)
Iron: 142.8 ug/dL (ref 40.0–160.0)
TIBC: 293 ug/dL (ref 250–450)
Transferrin: 209 mg/dL (ref 180.0–382.0)

## 2021-06-26 LAB — MAGNESIUM: Magnesium: 2.1 mg/dL (ref 1.6–2.6)

## 2021-06-26 LAB — PHOSPHORUS: Phosphorus: 3.4 mg/dL (ref 2.3–4.7)

## 2021-06-27 LAB — VITAMIN D,25 OH,TOTAL: Vitamin D 25-Hydroxy: 30.7 ng/mL (ref 30.0–80.0)

## 2021-06-30 LAB — VH MISCELLANEOUS TEST
Specimen type/source/volume: 2
Test code and name of test: 82747

## 2021-07-18 ENCOUNTER — Ambulatory Visit (INDEPENDENT_AMBULATORY_CARE_PROVIDER_SITE_OTHER): Payer: No Typology Code available for payment source | Admitting: Urology

## 2021-07-28 NOTE — Progress Notes (Signed)
Cynthia Rojas is an 40 y.o. female.    Stones stated June 2022.    05/01/21: NC CT AP reviewed. 2 mm left proximal ureteral stone with mild hydronephrosis      06/03/2021: [place] CTAP reviewed.  4 mm left UVJ stone with moderate hydroureteronephrosis      She has strained the urine intermittently and did not catch any stones.    She continues with very mild "bruise" in left lower back.  No hematuria at this point.    She had sleeve gastrectomy in 10/2020.    She takes a multivitamin and B12    Patient has had issues with low blood pressure worsening after weight loss and systolic runs in the 90s.  This is symptomatic and she manages by increasing her salt intake.    FH of kidney stones.     Past Medical History:   Diagnosis Date    Anxiety 12/11/2017    Dysthymia 12/11/2017    Irritable bowel syndrome 04/1999    Migraine 10/2004    Morbid obesity with BMI of 40.0-44.9, adult 02/11/2017    No known health problems     Type 2 diabetes mellitus 12/06/2017    HgA1C 6.5 % on 12/05/2017       Allergies:   Allergies   Allergen Reactions    Amoxicillin Rash       Active Problems:    * No active hospital problems. *    Blood pressure 107/71, pulse 74, temperature 97.6 F (36.4 C), resp. rate 16, height 1.753 m (5\' 9" ), weight 92.9 kg (204 lb 12.8 oz), SpO2 99 %.    ROS  No fever or chills    Physical Exam  Well nourished, well developed, no acute distress  Normal respiratory effort, no accessory muscle use  Alert awake oriented x3    Assessment:  3 mm left UVJ stone: Unclear if she passed the stone given that she has persistent mild ache in the lower flank    Plan:  -Recommended CT pelvis  -Discussed dietary modifications to reduce stone recurrence formation.  Recommended 24-hour urine testing and PTH.  Patient can have PTH done at [facility] lab today.  I will have my office in [place] complete a 24-hour urine form and mail to the patient.    The office will contact her with CT pelvis and 24-hour urine results when  available    Luz Brazen, MD  08/01/2021

## 2021-08-01 ENCOUNTER — Encounter (INDEPENDENT_AMBULATORY_CARE_PROVIDER_SITE_OTHER): Payer: Self-pay | Admitting: Urology

## 2021-08-01 ENCOUNTER — Ambulatory Visit (INDEPENDENT_AMBULATORY_CARE_PROVIDER_SITE_OTHER): Payer: No Typology Code available for payment source | Admitting: Urology

## 2021-08-01 VITALS — BP 107/71 | HR 74 | Temp 97.6°F | Resp 16 | Ht 69.0 in | Wt 204.8 lb

## 2021-08-01 DIAGNOSIS — N201 Calculus of ureter: Secondary | ICD-10-CM

## 2021-08-10 ENCOUNTER — Ambulatory Visit (INDEPENDENT_AMBULATORY_CARE_PROVIDER_SITE_OTHER)
Admission: RE | Admit: 2021-08-10 | Discharge: 2021-08-10 | Disposition: A | Discharge status: Home / Self Care | Payer: No Typology Code available for payment source | Source: Ambulatory Visit | Attending: Urology | Admitting: Urology

## 2021-08-10 DIAGNOSIS — N201 Calculus of ureter: Secondary | ICD-10-CM

## 2021-08-11 ENCOUNTER — Telehealth (INDEPENDENT_AMBULATORY_CARE_PROVIDER_SITE_OTHER): Payer: Self-pay | Admitting: Urology

## 2021-08-11 NOTE — Telephone Encounter (Signed)
Patient contacted and informed her stone passed based on CT review.

## 2021-10-23 ENCOUNTER — Ambulatory Visit (INDEPENDENT_AMBULATORY_CARE_PROVIDER_SITE_OTHER): Payer: No Typology Code available for payment source | Admitting: Family

## 2021-10-23 ENCOUNTER — Encounter (INDEPENDENT_AMBULATORY_CARE_PROVIDER_SITE_OTHER): Payer: Self-pay

## 2021-10-23 VITALS — BP 95/57 | HR 74 | Temp 98.4°F | Resp 18 | Ht 69.0 in | Wt 205.0 lb

## 2021-10-23 DIAGNOSIS — S43402A Unspecified sprain of left shoulder joint, initial encounter: Secondary | ICD-10-CM

## 2021-10-23 MED ORDER — METHYLPREDNISOLONE 4 MG PO TBPK
ORAL_TABLET | ORAL | 0 refills | Status: DC
Start: 2021-10-23 — End: 2022-02-12

## 2021-10-23 NOTE — Progress Notes (Signed)
Subjective:    Patient ID: Cynthia Rojas is a 40 y.o. female.    HPI  This female patient presents today to be evaluated for left shoulder pain that she has experienced since Wednesday (five days ago).  She states that she was raking, doing laundry, and performing other household activities.  She has a history of her arm popping when she rotates her shoulder.  She states that she continues to experience this pop.  She is now also experiencing some numbness extending down her arm.  She has a history of left shoulder dislocation due to a pit bull pulling her while she held its leash.  She denies known injury to her shoulder.  She has treated her symptoms with heat, Tylenol, and decreased use.    The following portions of the patient's history were reviewed and updated as appropriate: allergies, current medications, past family history, past medical history, past social history, past surgical history, and problem list.    Review of Systems   Constitutional:  Negative for chills, fatigue and fever.   Gastrointestinal:  Negative for nausea and vomiting.   Musculoskeletal:         Left shoulder pain       Objective:    BP 95/57   Pulse 74   Temp 98.4 F (36.9 C) (Oral)   Resp 18   Ht 1.753 m (5\' 9" )   Wt 93 kg (205 lb)   BMI 30.27 kg/m     Physical Exam  Constitutional:       General: She is not in acute distress.     Appearance: She is not ill-appearing or toxic-appearing.   Eyes:      Conjunctiva/sclera: Conjunctivae normal.   Pulmonary:      Effort: Pulmonary effort is normal. No respiratory distress.   Musculoskeletal:      Left shoulder: Tenderness present. No swelling, deformity, effusion or crepitus. Decreased range of motion.      Left wrist: Normal pulse.      Comments: Right shoulder flexion: 180 degrees (normal 180 degrees)  Right shoulder extension: 50 degrees (normal 50 degrees)  Right shoulder adduction: 50 degrees (normal 50 degrees)  Right shoulder abduction: 90 degrees (normal 180  degrees)  Internal rotation and external rotation: WNL     Skin:     General: Skin is warm and dry.   Neurological:      Mental Status: She is alert.   Psychiatric:         Mood and Affect: Mood normal.         Behavior: Behavior normal.         Assessment and Plan:       Cynthia Rojas was seen today for shoulder pain.    Diagnoses and all orders for this visit:    Sprain of left shoulder, unspecified shoulder sprain type, initial encounter  -     Ambulatory referral to Orthopedic Surgery    Other orders  -     methylPREDNISolone (Medrol) 4 MG tablet; follow package directions        PLAN:  -Low suspicion for bony etiology to the patient's pain due to the nature of her symptom onset and current pain.   -Suspicion for sprain versus shoulder impingement.   -A Medrol Dosepak was prescribed to decrease inflammation.  -The patient was encouraged to follow-up with bone and joint as her symptoms are likely due to a soft tissue injury as opposed to a bony process.  Raina Mina, NP  [facility]  10/23/2021  8:55 AM

## 2021-12-01 ENCOUNTER — Encounter (INDEPENDENT_AMBULATORY_CARE_PROVIDER_SITE_OTHER): Payer: Self-pay

## 2021-12-01 NOTE — Progress Notes (Signed)
Has not been seen since IC appt on 04/18/20. Need to follow up with pt to see if still interested

## 2022-01-08 ENCOUNTER — Encounter (INDEPENDENT_AMBULATORY_CARE_PROVIDER_SITE_OTHER): Payer: Self-pay

## 2022-02-08 ENCOUNTER — Encounter (RURAL_HEALTH_CENTER)
Payer: No Typology Code available for payment source | Admitting: Student in an Organized Health Care Education/Training Program

## 2022-02-12 ENCOUNTER — Encounter (RURAL_HEALTH_CENTER): Payer: Self-pay | Admitting: Student in an Organized Health Care Education/Training Program

## 2022-02-12 ENCOUNTER — Ambulatory Visit (RURAL_HEALTH_CENTER)
Payer: No Typology Code available for payment source | Admitting: Student in an Organized Health Care Education/Training Program

## 2022-02-12 VITALS — BP 99/55 | HR 70 | Temp 98.0°F | Resp 16 | Ht 69.0 in | Wt 225.1 lb

## 2022-02-12 DIAGNOSIS — Z903 Acquired absence of stomach [part of]: Secondary | ICD-10-CM

## 2022-02-12 DIAGNOSIS — Z0000 Encounter for general adult medical examination without abnormal findings: Secondary | ICD-10-CM

## 2022-02-12 NOTE — Progress Notes (Unsigned)
[facility]  [address]  [place], [place] [address]  [phone]  02/12/2022           Patient Identification  Name:Cynthia Rojas   Age:41 y.o.  Sex: female  DOB: 02-Jan-1981    CC: Annual physical exam    HPI:  Cynthia Rojas is a 41 y.o female presenting to clinic for annual physical. She underwent a gastric sleeve in December 2021.     Currently takes a Bariatric Multi-vitamin, B12 and Biotin supplement. Also takes OTC PPI      Colonoscopy: not indicated, no family history of colon cancer. Has baseline IBS, fluctuates between constipation and diarrhea.     She has an IUD in place. No regular periods. Pap smear: September 2022. Last Mammogram: January 2022.     Diet: in general high protein, low carb diet. Drinks plenty of water. Caffeine: 1 drink a week. No issues falling asleep. Sleeps about seven hours.      Past Medical History:   Diagnosis Date    Anxiety 12/11/2017    Dysthymia 12/11/2017    Irritable bowel syndrome 04/1999    Migraine 10/2004    Morbid obesity with BMI of 40.0-44.9, adult 02/11/2017    No known health problems     Type 2 diabetes mellitus 12/06/2017    HgA1C 6.5 % on 12/05/2017       Past Surgical History:   Procedure Laterality Date    CHOLECYSTECTOMY  10/2005       Allergies: Amoxicillin    ROS:  Review of Systems: Items that patient complains of are in bold.  Items that the patient denies are not bolded.   General: Fever, Chills.   Eyes: Blurry vision, Discharge  Ears/Nose/Throat: Earache, Congestion, Sore Throat.   Cardiovascular: Chest Pain, Palpitations, Peripheral Edema.   Respiratory: Cough, Dyspnea.   Gastrointestinal: Nausea, Vomiting, Diarrhea, Constipation, Abdominal Pain, Melena, Hematochezia.   Genitourinary:  Dysuria, Hematuria, Discharge or Incontinence.  Musculoskeletal:  Joint Pain.   Skin: Rash   Neurologic: Headache,  Dizziness, Weakness.   Heme/Lymphatic: Abnormal Bruising.    Medications:  Current Outpatient Medications   Medication Sig Dispense Refill    Cyanocobalamin (VITAMIN B-12 CR PO) Take by mouth      levonorgestrel (MIRENA) 20 MCG/24HR IUD 1 each by Intrauterine route once      Multiple Vitamin (multivitamin) capsule Take 1 capsule by mouth daily      pantoprazole (PROTONIX) 40 MG tablet        No current facility-administered medications for this visit.       Family History   Problem Relation Age of Onset    Depression Mother     Celiac disease Mother     Diabetes Father     Crohn's disease Maternal Grandmother     Heart disease Maternal Grandfather     Heart attack Paternal Grandfather        Social History:  Patient  reports that she has never smoked. She has never used smokeless tobacco. She reports current alcohol use of about 2.0 standard drinks of alcohol per week. She reports that she does not use drugs.    Objective:   BP 99/55   Pulse 70   Temp 98 F (36.7 C) (Temporal)   Resp 16   Ht 1.753 m (5\' 9" )   Wt 102.1 kg (225 lb 1.6 oz)   SpO2 100%   BMI 33.24 kg/m     Physical Exam  General:: Well-nourished,  Well-developed, appearing stated age.  Eyes: PERRL, no conjunctival injection  ENMT: Atraumatic external nose and ears. Moist MM, no exudate or tonsillar swelling.   Neck: Symmetric, trachea midline, No thyromegaly.  CVS: S1/S2, No murmurs or gallops. Peripheral pulses 2+ and equal in all extremities.  RESP: Unlabored respiratory effort. Clear to auscultation bilaterally (CTAB).  GI: Non-tender, Non-distended, Normal bowel sounds.   GU: Deferred.   MSK: Atraumatic, Extremities w/o deformity or TTP. No cyanosis or clubbing  Skin: Warm, Dry. No rashes or lesions  Neuro: AAOx3, CNs II-XII grossly intact. Sensation grossly intact.  Psych Appropriate mood and affect      Assessment:     Problem List Items Addressed This Visit    None  Visit Diagnoses       Annual physical exam    -  Primary    H/O gastric  sleeve              Plan:       Follow up ***    Apolonio Schneiders, MD  02/12/2022

## 2022-02-17 ENCOUNTER — Other Ambulatory Visit
Admission: RE | Admit: 2022-02-17 | Discharge: 2022-02-17 | Disposition: A | Discharge status: Home / Self Care | Payer: No Typology Code available for payment source | Source: Ambulatory Visit | Attending: Surgery | Admitting: Surgery

## 2022-02-17 DIAGNOSIS — E649 Sequelae of unspecified nutritional deficiency: Secondary | ICD-10-CM | POA: Insufficient documentation

## 2022-02-17 DIAGNOSIS — K912 Postsurgical malabsorption, not elsewhere classified: Secondary | ICD-10-CM | POA: Insufficient documentation

## 2022-02-17 DIAGNOSIS — Z9884 Bariatric surgery status: Secondary | ICD-10-CM | POA: Insufficient documentation

## 2022-02-17 LAB — COMPREHENSIVE METABOLIC PANEL
ALT: 8 U/L (ref 0–55)
AST (SGOT): 15 U/L (ref 10–42)
Albumin/Globulin Ratio: 1.23 Ratio (ref 0.80–2.00)
Albumin: 3.8 gm/dL (ref 3.5–5.0)
Alkaline Phosphatase: 70 U/L (ref 40–145)
Anion Gap: 14 mMol/L (ref 7.0–18.0)
BUN / Creatinine Ratio: 17.1 Ratio (ref 10.0–30.0)
BUN: 13 mg/dL (ref 7–22)
Bilirubin, Total: 1.1 mg/dL (ref 0.1–1.2)
CO2: 24 mMol/L (ref 20–30)
Calcium: 9.6 mg/dL (ref 8.5–10.5)
Chloride: 107 mMol/L (ref 98–110)
Creatinine: 0.76 mg/dL (ref 0.60–1.20)
EGFR: 100 mL/min/{1.73_m2} (ref 60–150)
Globulin: 3.1 gm/dL (ref 2.0–4.0)
Glucose: 103 mg/dL — ABNORMAL HIGH (ref 71–99)
Osmolality Calculated: 280 mOsm/kg (ref 275–300)
Potassium: 5 mMol/L (ref 3.5–5.3)
Protein, Total: 6.9 gm/dL (ref 6.0–8.3)
Sodium: 140 mMol/L (ref 136–147)

## 2022-02-17 LAB — IRON PROFILE
% Saturation: 32 % (ref 15–50)
Iron: 111 ug/dL (ref 40.0–160.0)
TIBC: 350 ug/dL (ref 250–450)
Transferrin: 250 mg/dL (ref 180.0–382.0)

## 2022-02-17 LAB — CBC AND DIFFERENTIAL
Basophils %: 0.4 % (ref 0.0–3.0)
Basophils Absolute: 0 10*3/uL (ref 0.0–0.3)
Eosinophils %: 2 % (ref 0.0–7.0)
Eosinophils Absolute: 0.1 10*3/uL (ref 0.0–0.8)
Hematocrit: 44 % (ref 36.0–48.0)
Hemoglobin: 14.7 gm/dL (ref 12.0–16.0)
Lymphocytes Absolute: 1.2 10*3/uL (ref 0.6–5.1)
Lymphocytes: 19.2 % (ref 15.0–46.0)
MCH: 31 pg (ref 28–35)
MCHC: 33 gm/dL (ref 32–36)
MCV: 92 fL (ref 80–100)
MPV: 7.7 fL (ref 6.0–10.0)
Monocytes Absolute: 0.5 10*3/uL (ref 0.1–1.7)
Monocytes: 8.4 % (ref 3.0–15.0)
Neutrophils %: 70 % (ref 42.0–78.0)
Neutrophils Absolute: 4.3 10*3/uL (ref 1.7–8.6)
PLT CT: 283 10*3/uL (ref 130–440)
RBC: 4.8 10*6/uL (ref 3.80–5.00)
RDW: 11.7 % (ref 11.0–14.0)
WBC: 6.2 10*3/uL (ref 4.0–11.0)

## 2022-02-17 LAB — LIPID PANEL
Cholesterol: 155 mg/dL (ref 75–199)
Coronary Heart Disease Risk: 3.1
HDL: 50 mg/dL (ref 45–65)
LDL Calculated: 92 mg/dL
Triglycerides: 67 mg/dL (ref 10–150)
VLDL: 13 (ref 0–40)

## 2022-02-17 LAB — FERRITIN: Ferritin: 522.7 ng/mL — ABNORMAL HIGH (ref 4.6–204.0)

## 2022-02-17 LAB — HEMOGLOBIN A1C
Estimated Average Glucose: 105 mg/dL
Hgb A1C, %: 5.3 %

## 2022-02-17 LAB — PHOSPHORUS: Phosphorus: 3.9 mg/dL (ref 2.3–4.7)

## 2022-02-17 LAB — VITAMIN B12: Vitamin B-12: 594 pg/mL (ref 213–816)

## 2022-02-17 LAB — MAGNESIUM: Magnesium: 2.2 mg/dL (ref 1.6–2.6)

## 2022-02-18 LAB — VITAMIN D,25 OH,TOTAL: Vitamin D 25-Hydroxy: 29.1 ng/mL — ABNORMAL LOW (ref 30.0–80.0)

## 2022-02-19 ENCOUNTER — Other Ambulatory Visit: Payer: Self-pay

## 2022-02-21 LAB — VH MISCELLANEOUS TEST: Specimen type/source/volume: 2

## 2022-04-25 ENCOUNTER — Encounter: Payer: Self-pay | Admitting: Surgery

## 2022-04-25 DIAGNOSIS — R12 Heartburn: Secondary | ICD-10-CM

## 2022-05-30 ENCOUNTER — Ambulatory Visit
Admission: RE | Admit: 2022-05-30 | Discharge: 2022-05-30 | Disposition: A | Discharge status: Home / Self Care | Payer: No Typology Code available for payment source | Source: Ambulatory Visit | Attending: Surgery | Admitting: Surgery

## 2022-05-30 ENCOUNTER — Other Ambulatory Visit: Payer: Self-pay | Admitting: Surgery

## 2022-05-30 DIAGNOSIS — R12 Heartburn: Secondary | ICD-10-CM

## 2022-05-30 DIAGNOSIS — K219 Gastro-esophageal reflux disease without esophagitis: Secondary | ICD-10-CM | POA: Insufficient documentation

## 2022-05-30 DIAGNOSIS — Z9884 Bariatric surgery status: Secondary | ICD-10-CM | POA: Insufficient documentation

## 2022-05-30 DIAGNOSIS — K449 Diaphragmatic hernia without obstruction or gangrene: Secondary | ICD-10-CM | POA: Insufficient documentation

## 2022-05-30 MED ORDER — SOD BICARB-CITRIC AC-SIMETH 2.21-1.53-0.04 G PO PACK
1.0000 | PACK | Freq: Once | ORAL | Status: AC | PRN
Start: 2022-05-30 — End: 2022-05-30
  Administered 2022-05-30: 1 via ORAL

## 2022-05-30 MED ORDER — BARIUM SULFATE 96 % PO SUSR
50.0000 mL | Freq: Once | ORAL | Status: AC | PRN
Start: 2022-05-30 — End: 2022-05-30
  Administered 2022-05-30: 50 mL via ORAL

## 2022-05-30 MED ORDER — BARIUM SULFATE 98 % PO SUSR
75.0000 mL | Freq: Once | ORAL | Status: AC | PRN
Start: 2022-05-30 — End: 2022-05-30
  Administered 2022-05-30: 75 mL via ORAL
  Filled 2022-05-30: qty 75

## 2022-07-29 ENCOUNTER — Encounter (INDEPENDENT_AMBULATORY_CARE_PROVIDER_SITE_OTHER): Payer: Self-pay | Admitting: Family

## 2022-07-29 ENCOUNTER — Ambulatory Visit (INDEPENDENT_AMBULATORY_CARE_PROVIDER_SITE_OTHER): Payer: No Typology Code available for payment source | Admitting: Family

## 2022-07-29 VITALS — BP 106/74 | HR 72 | Temp 98.3°F | Resp 17 | Ht 69.0 in | Wt 210.0 lb

## 2022-07-29 DIAGNOSIS — J039 Acute tonsillitis, unspecified: Secondary | ICD-10-CM

## 2022-07-29 DIAGNOSIS — J029 Acute pharyngitis, unspecified: Secondary | ICD-10-CM

## 2022-07-29 DIAGNOSIS — Z20822 Contact with and (suspected) exposure to covid-19: Secondary | ICD-10-CM

## 2022-07-29 DIAGNOSIS — M791 Myalgia, unspecified site: Secondary | ICD-10-CM

## 2022-07-29 LAB — VH AMB POCT INFLUENZA A/B
Rapid Influenza A Ag POCT: NEGATIVE
Rapid Influenza B AG POCT: NEGATIVE

## 2022-07-29 LAB — VH RAPID STREP UCC POCT: Rapid Strep A Screen POCT: NEGATIVE

## 2022-07-29 LAB — VH AMB POCT SOFIA (TM)SARS CORONAVIRUS ANTIGEN FIA: Sofia SARS-CoV-2 Ag POCT: NEGATIVE

## 2022-07-29 MED ORDER — AZITHROMYCIN 250 MG PO TABS
ORAL_TABLET | ORAL | 0 refills | Status: AC
Start: 2022-07-29 — End: 2022-08-04

## 2022-07-29 NOTE — Progress Notes (Signed)
July 29, 2022    Subjective:    Patient ID: Cynthia Rojas is a 41 y.o. female     Chief Complaint   Patient presents with    Sore Throat     Pt states she developed a sore throat and some body aches yesterday.        Sore Throat          Patient developed a sore throat yesterday and she noted some white spots.  She grew concerned and came in as she is getting ready to leave for [place] on Saturday.    The following portions of the patient's history were reviewed and updated as appropriate: allergies, current medications, past medical history, past surgical history and problem list.     Past Medical History:   Diagnosis Date    Anxiety 12/11/2017    Dysthymia 12/11/2017    Irritable bowel syndrome 04/1999    Migraine 10/2004    Morbid obesity with BMI of 40.0-44.9, adult 02/11/2017    No known health problems     Type 2 diabetes mellitus 12/06/2017    HgA1C 6.5 % on 12/05/2017     Past Surgical History:   Procedure Laterality Date    CHOLECYSTECTOMY  10/2005     Social History     Socioeconomic History    Marital status: Married     Spouse name: None    Number of children: None    Years of education: None    Highest education level: None   Occupational History    None   Tobacco Use    Smoking status: Never    Smokeless tobacco: Never    Tobacco comments:     Never used   Vaping Use    Vaping Use: Never used   Substance and Sexual Activity    Alcohol use: Yes     Alcohol/week: 2.0 standard drinks of alcohol     Types: 1 Cans of beer, 1 Standard drinks or equivalent per week     Comment: social    Drug use: Never    Sexual activity: Yes     Partners: Male     Birth control/protection: I.U.D.   Other Topics Concern    Dietary supplements / vitamins Yes     Comment: Eleve8, Acceler8    Anesthesia problems No    Blood thinners No    Pregnant No    Future Children No    Number of Pregnancies? Yes     Comment: 6    Number of children Yes     Comment: 3    Miscarriages / Abortions? Yes     Comment: 3    Diet Center  No    Doylene Bode No    LA Weight Loss Yes    Nutri-System No    Opti-Fast / Medi-Fast No    Overeaters Anonymous No    [facility] No    TOPS No    Weight Watchers No    Atkins No    Binging / Purging No    Calorie Counting No    Fasting No    High Protein Yes    Low Carb Yes    Low Fat No    [facility] Diet No    Slim Fast Yes    [place] Yes    Stationary cycle or treadmill Yes    Gym/fitness Classes Yes    Home exercise/video Yes    Swimming Yes  Weight training Yes    Walking or running Yes    Acutrim No    [place] No    Contrave No    Dexatrim No    Diethylpropion No    Fastin No    Fen - Phen No    Ionamin / Adipex No    Phentermine Yes    Qsymia No    Prozac No    Saxenda No    Topamax Yes    Wellbutrin No    Xenical (Orlistat, Alli) No    Other Med Yes     Comment: diet supplements don't recall names   Social History Narrative    None     Social Determinants of Health     Financial Resource Strain: Not on file   Food Insecurity: Not on file   Transportation Needs: Not on file   Physical Activity: Not on file   Stress: Not on file   Social Connections: Not on file   Intimate Partner Violence: Not on file   Housing Stability: Not on file     Current Outpatient Medications on File Prior to Visit   Medication Sig Dispense Refill    Cyanocobalamin (VITAMIN B-12 CR PO) Take by mouth      levonorgestrel (MIRENA) 20 MCG/24HR IUD 1 Intra Uterine Device (52 mg) by Intrauterine route once      Multiple Vitamin (multivitamin) capsule Take 1 capsule by mouth daily      pantoprazole (PROTONIX) 40 MG tablet       phentermine 15 MG capsule Take 1 capsule (15 mg) by mouth every morning       No current facility-administered medications on file prior to visit.     Allergies   Allergen Reactions    Amoxicillin Rash         Objective:     Vitals:    07/29/22 1002   BP: 106/74   Pulse: 72   Resp: 17   Temp: 98.3 F (36.8 C)       Physical Exam  HENT:      Head: Normocephalic.      Nose: No congestion.       Mouth/Throat:      Pharynx: Oropharyngeal exudate and posterior oropharyngeal erythema present.   Cardiovascular:      Rate and Rhythm: Normal rate.      Heart sounds: Normal heart sounds.   Pulmonary:      Breath sounds: Normal breath sounds.   Lymphadenopathy:      Cervical: Cervical adenopathy present.   Neurological:      Mental Status: She is alert.            Lab Results from today's visit:  Recent Results (from the past 4 hour(s))   [facility] [place] SARS Coronavirus Antigen [facility] POCT    Collection Time: 07/29/22 10:08 AM   Result Value Ref Range    [place] SARS-CoV-2 Ag POCT Negative Negative   [facility] Influenza A/B POCT    Collection Time: 07/29/22 10:08 AM   Result Value Ref Range    POCT QC Pass     Rapid Influenza A Ag POCT Negative Negative    Rapid Influenza B AG POCT Negative Negative   Rapid Strep A POCT    Collection Time: 07/29/22 10:08 AM   Result Value Ref Range    POCT QC Pass     Rapid Strep A Screen POCT Negative Negative       Radiology Results from today's visit:  No results found.          Assessment and Plan:       Cynthia Rojas was seen today for sore throat.    Diagnoses and all orders for this visit:    Tonsillitis  -     azithromycin (Zithromax Z-Pak) 250 MG tablet; Take 2 tabs (500 mg) in a single loading dose on day 1, followed by 250 mg once daily on days 2 to 5    Pharyngitis, unspecified etiology  -     [facility] [place] SARS Coronavirus Antigen FIA POCT  -     [facility] Influenza A/B POCT  -     Rapid Strep A POCT        PLAN:  Meets center criteria and patient getting ready to travel out of the country.  Recommend going ahead and treating with azithromycin as it is a short course and will treat for atypical strep as well.  Continue to push fluids.  Follow-up as needed for any concerns.        Precious Reel, NP  [facility]  07/29/2022 10:37 AM

## 2024-04-14 ENCOUNTER — Encounter (INDEPENDENT_AMBULATORY_CARE_PROVIDER_SITE_OTHER): Payer: Self-pay
